# Patient Record
Sex: Male | Born: 1970 | Race: White | Hispanic: No | Marital: Married | Smoking: Never smoker
Health system: Southern US, Community
[De-identification: ages and names within clinical notes are randomized; demographics above are authoritative.]

## PROBLEM LIST (undated history)

## (undated) DIAGNOSIS — F419 Anxiety disorder, unspecified: Secondary | ICD-10-CM

## (undated) DIAGNOSIS — G4730 Sleep apnea, unspecified: Secondary | ICD-10-CM

## (undated) DIAGNOSIS — F32A Depression, unspecified: Secondary | ICD-10-CM

## (undated) HISTORY — DX: Sleep apnea, unspecified: G47.30

## (undated) HISTORY — DX: Anxiety disorder, unspecified: F41.9

---

## 2021-01-08 ENCOUNTER — Inpatient Hospital Stay
Admission: EM | Admit: 2021-01-08 | Discharge: 2021-01-12 | DRG: 177 | Disposition: A | Discharge status: Home / Self Care | Payer: BC Managed Care – PPO | Attending: Internal Medicine | Admitting: Internal Medicine

## 2021-01-08 ENCOUNTER — Emergency Department: Payer: BC Managed Care – PPO

## 2021-01-08 ENCOUNTER — Other Ambulatory Visit: Payer: Self-pay

## 2021-01-08 DIAGNOSIS — Z28310 Unvaccinated for covid-19: Secondary | ICD-10-CM

## 2021-01-08 DIAGNOSIS — J1282 Pneumonia due to coronavirus disease 2019: Secondary | ICD-10-CM | POA: Diagnosis present

## 2021-01-08 DIAGNOSIS — U071 COVID-19: Principal | ICD-10-CM

## 2021-01-08 DIAGNOSIS — J9601 Acute respiratory failure with hypoxia: Secondary | ICD-10-CM | POA: Diagnosis present

## 2021-01-08 DIAGNOSIS — E1165 Type 2 diabetes mellitus with hyperglycemia: Secondary | ICD-10-CM | POA: Diagnosis present

## 2021-01-08 HISTORY — DX: Depression, unspecified: F32.A

## 2021-01-08 LAB — CBC WITH DIFFERENTIAL/PLATELET
Abs Immature Granulocytes: 0.02 10*3/uL (ref 0.00–0.07)
Basophils Absolute: 0 10*3/uL (ref 0.0–0.1)
Basophils Relative: 0 %
Eosinophils Absolute: 0 10*3/uL (ref 0.0–0.5)
Eosinophils Relative: 0 %
HCT: 46.5 % (ref 39.0–52.0)
Hemoglobin: 15.8 g/dL (ref 13.0–17.0)
Immature Granulocytes: 0 %
Lymphocytes Relative: 9 %
Lymphs Abs: 0.4 10*3/uL — ABNORMAL LOW (ref 0.7–4.0)
MCH: 30.5 pg (ref 26.0–34.0)
MCHC: 34 g/dL (ref 30.0–36.0)
MCV: 89.8 fL (ref 80.0–100.0)
Monocytes Absolute: 0.5 10*3/uL (ref 0.1–1.0)
Monocytes Relative: 9 %
Neutro Abs: 4 10*3/uL (ref 1.7–7.7)
Neutrophils Relative %: 82 %
Platelets: 181 10*3/uL (ref 150–400)
RBC: 5.18 MIL/uL (ref 4.22–5.81)
RDW: 13.7 % (ref 11.5–15.5)
WBC: 4.9 10*3/uL (ref 4.0–10.5)
nRBC: 0 % (ref 0.0–0.2)

## 2021-01-08 LAB — BASIC METABOLIC PANEL
Anion gap: 12 (ref 5–15)
BUN: 17 mg/dL (ref 6–20)
CO2: 25 mmol/L (ref 22–32)
Calcium: 8.8 mg/dL — ABNORMAL LOW (ref 8.9–10.3)
Chloride: 96 mmol/L — ABNORMAL LOW (ref 98–111)
Creatinine, Ser: 1.22 mg/dL (ref 0.61–1.24)
GFR, Estimated: >60 mL/min (ref >60)
Glucose, Bld: 140 mg/dL — ABNORMAL HIGH (ref 70–99)
Potassium: 3.8 mmol/L (ref 3.5–5.1)
Sodium: 133 mmol/L — ABNORMAL LOW (ref 135–145)

## 2021-01-08 LAB — LACTIC ACID, PLASMA
Lactic Acid, Venous: 1.5 mmol/L (ref 0.5–1.9)
Lactic Acid, Venous: 2.6 mmol/L (ref 0.5–1.9)

## 2021-01-08 LAB — D-DIMER, QUANTITATIVE: D-Dimer, Quant: 0.77 ug/mL-FEU — ABNORMAL HIGH (ref 0.00–0.50)

## 2021-01-08 MED ORDER — ONDANSETRON HCL 4 MG/2ML IJ SOLN
4.0000 mg | Freq: Four times a day (QID) | INTRAMUSCULAR | Status: DC | PRN
Start: 2021-01-08 — End: 2021-01-12

## 2021-01-08 MED ORDER — SODIUM CHLORIDE 0.9 % IV SOLN
100.0000 mg | Freq: Every day | INTRAVENOUS | Status: AC
  Administered 2021-01-09 – 2021-01-12 (×4): 100 mg via INTRAVENOUS
  Filled 2021-01-08 (×5): qty 20

## 2021-01-08 MED ORDER — METHYLPREDNISOLONE SODIUM SUCC 125 MG IJ SOLR
0.5000 mg/kg | Freq: Two times a day (BID) | INTRAMUSCULAR | Status: AC
  Administered 2021-01-08 – 2021-01-11 (×6): 54.375 mg via INTRAVENOUS
  Filled 2021-01-08 (×7): qty 2

## 2021-01-08 MED ORDER — ASCORBIC ACID 500 MG PO TABS
500.0000 mg | ORAL_TABLET | Freq: Every day | ORAL | Status: DC
  Administered 2021-01-09 – 2021-01-12 (×4): 500 mg via ORAL
  Filled 2021-01-08 (×4): qty 1

## 2021-01-08 MED ORDER — KETOROLAC TROMETHAMINE 30 MG/ML IJ SOLN
30.0000 mg | Freq: Once | INTRAMUSCULAR | Status: AC
  Administered 2021-01-08: 30 mg via INTRAVENOUS
  Filled 2021-01-08: qty 1

## 2021-01-08 MED ORDER — ONDANSETRON HCL 4 MG PO TABS: 4.0000 mg | ORAL_TABLET | Freq: Four times a day (QID) | ORAL | Status: DC | PRN

## 2021-01-08 MED ORDER — ENOXAPARIN SODIUM 60 MG/0.6ML IJ SOSY
0.5000 mg/kg | PREFILLED_SYRINGE | INTRAMUSCULAR | Status: DC
  Administered 2021-01-08 – 2021-01-11 (×4): 55 mg via SUBCUTANEOUS
  Filled 2021-01-08 (×4): qty 0.6

## 2021-01-08 MED ORDER — ACETAMINOPHEN 325 MG PO TABS
650.0000 mg | ORAL_TABLET | Freq: Once | ORAL | Status: AC
  Administered 2021-01-08: 650 mg via ORAL
  Filled 2021-01-08: qty 2

## 2021-01-08 MED ORDER — HYDROCOD POLST-CPM POLST ER 10-8 MG/5ML PO SUER
5.0000 mL | Freq: Two times a day (BID) | ORAL | Status: DC | PRN
  Administered 2021-01-09 – 2021-01-11 (×3): 5 mL via ORAL
  Filled 2021-01-08 (×3): qty 5

## 2021-01-08 MED ORDER — SODIUM CHLORIDE 0.9 % IV BOLUS
1000.0000 mL | Freq: Once | INTRAVENOUS | Status: AC
  Administered 2021-01-08: 1000 mL via INTRAVENOUS

## 2021-01-08 MED ORDER — POLYETHYLENE GLYCOL 3350 17 G PO PACK: 17.0000 g | PACK | Freq: Every day | ORAL | Status: DC | PRN

## 2021-01-08 MED ORDER — ZINC SULFATE 220 (50 ZN) MG PO CAPS
220.0000 mg | ORAL_CAPSULE | Freq: Every day | ORAL | Status: DC
  Administered 2021-01-09 – 2021-01-12 (×4): 220 mg via ORAL
  Filled 2021-01-08 (×4): qty 1

## 2021-01-08 MED ORDER — ACETAMINOPHEN 325 MG PO TABS
650.0000 mg | ORAL_TABLET | Freq: Four times a day (QID) | ORAL | Status: DC | PRN
  Administered 2021-01-10 – 2021-01-12 (×3): 650 mg via ORAL
  Filled 2021-01-08 (×3): qty 2

## 2021-01-08 MED ORDER — ALBUTEROL SULFATE HFA 108 (90 BASE) MCG/ACT IN AERS
2.0000 | INHALATION_SPRAY | Freq: Four times a day (QID) | RESPIRATORY_TRACT | Status: DC
  Administered 2021-01-08 – 2021-01-12 (×15): 2 via RESPIRATORY_TRACT
  Filled 2021-01-08 (×2): qty 6.7

## 2021-01-08 MED ORDER — SODIUM CHLORIDE 0.9 % IV SOLN
200.0000 mg | Freq: Once | INTRAVENOUS | Status: AC
  Administered 2021-01-08: 200 mg via INTRAVENOUS
  Filled 2021-01-08: qty 40

## 2021-01-08 MED ORDER — GUAIFENESIN-DM 100-10 MG/5ML PO SYRP
10.0000 mL | ORAL_SOLUTION | ORAL | Status: DC | PRN
  Administered 2021-01-09 – 2021-01-11 (×3): 10 mL via ORAL
  Filled 2021-01-08 (×3): qty 10

## 2021-01-08 MED ORDER — HYDROCODONE-ACETAMINOPHEN 5-325 MG PO TABS: 1.0000 | ORAL_TABLET | ORAL | Status: DC | PRN

## 2021-01-08 MED ORDER — PREDNISONE 50 MG PO TABS
50.0000 mg | ORAL_TABLET | Freq: Every day | ORAL | Status: DC
  Administered 2021-01-11 – 2021-01-12 (×2): 50 mg via ORAL
  Filled 2021-01-08 (×2): qty 1

## 2021-01-08 MED ORDER — TRAZODONE HCL 50 MG PO TABS: 25.0000 mg | ORAL_TABLET | Freq: Every evening | ORAL | Status: DC | PRN

## 2021-01-08 NOTE — ED Provider Notes (Signed)
[facility] Emergency Department Provider Note   ____________________________________________   Event Date/Time   First MD Initiated Contact with Patient 01/08/21 1241     (approximate)  I have reviewed the triage vital signs and the nursing notes.   HISTORY  Chief Complaint COVID+    HPI Jared Duncan is a 50 y.o. male patient presents with fever, body aches, headache, loss of taste, mild dyspnea with exertion, and diarrhea status post diagnosis of COVID-19 8 days ago.  Patient ECP prescribe Zithromax, Medrol Dosepak, and Tessalon Perles.  Patient states he does not feel he is getting better.  Rates his pain as 8/10.  Described pain as "achy".         History reviewed. No pertinent past medical history.  Patient Active Problem List   Diagnosis Date Noted  . Acute hypoxemic respiratory failure due to COVID-19 [facility]) 01/08/2021    History reviewed. No pertinent surgical history.  Prior to Admission medications   Not on File    Allergies Patient has no allergy information on record.  No family history on file.  Social History    Review of Systems Constitutional: Fever/chills, and body aches. Eyes: No visual changes. ENT: No sore throat.  Loss of taste. Cardiovascular: Denies chest pain. Respiratory: Denies shortness of breath.  Nonproductive cough. Gastrointestinal: No abdominal pain.  No nausea, no vomiting.  No diarrhea.  No constipation. Genitourinary: Negative for dysuria. Musculoskeletal: Negative for back pain. Skin: Negative for rash. Neurological: Positive for headaches, but denies focal weakness or numbness.   ____________________________________________   PHYSICAL EXAM:  VITAL SIGNS: ED Triage Vitals  Enc Vitals Group     BP 01/08/21 1131 140/82     Pulse Rate 01/08/21 1131 (!) 103     Resp 01/08/21 1131 18     Temp 01/08/21 1131 (!) 101.8 F (38.8 C)     Temp Source 01/08/21 1131 Oral     SpO2  01/08/21 1131 92 %     Weight --      Height --      Head Circumference --      Peak Flow --      Pain Score 01/08/21 1131 8     Pain Loc --      Pain Edu? --      Excl. in [place]? --     Constitutional: Febrile alert and oriented. Well appearing and in no acute distress. Eyes: Conjunctivae are normal. PERRL. EOMI. Head: Atraumatic. Nose: No congestion/rhinnorhea. Mouth/Throat: Mucous membranes are moist.  Oropharynx non-erythematous. Neck: No stridor.  Hematological/Lymphatic/Immunilogical: No cervical lymphadenopathy. Cardiovascular: Normal rate, regular rhythm. Grossly normal heart sounds.  Good peripheral circulation. Respiratory: Normal respiratory effort.  No retractions. Lungs CTAB. Gastrointestinal: Soft and nontender. No distention. No abdominal bruits. No CVA tenderness. Genitourinary: Deferred Musculoskeletal: No lower extremity tenderness nor edema.  No joint effusions. Neurologic:  Normal speech and language. No gross focal neurologic deficits are appreciated. No gait instability. Skin:  Skin is warm, dry and intact. No rash noted. Psychiatric: Mood and affect are normal. Speech and behavior are normal.  ____________________________________________   LABS (all labs ordered are listed, but only abnormal results are displayed)  Labs Reviewed  BASIC METABOLIC PANEL - Abnormal; Notable for the following components:      Result Value   Sodium 133 (*)    Chloride 96 (*)    Glucose, Bld 140 (*)    Calcium 8.8 (*)    All other components within normal  limits  CBC WITH DIFFERENTIAL/PLATELET - Abnormal; Notable for the following components:   Lymphs Abs 0.4 (*)    All other components within normal limits  CULTURE, BLOOD (ROUTINE X 2)  CULTURE, BLOOD (ROUTINE X 2)  LACTIC ACID, PLASMA  LACTIC ACID, PLASMA  HIV ANTIBODY (ROUTINE TESTING W REFLEX)  C-REACTIVE PROTEIN  D-DIMER, QUANTITATIVE    ____________________________________________  EKG   ____________________________________________  RADIOLOGY I, Sable Feil, personally viewed and evaluated these images (plain radiographs) as part of my medical decision making, as well as reviewing the written report by the radiologist.  ED MD interpretation: Pneumonia  Official radiology report(s): DG Chest Portable 1 View  Result Date: 01/08/2021 CLINICAL DATA:  Dyspnea and cough.  COVID-19 pneumonia. EXAM: PORTABLE CHEST 1 VIEW COMPARISON:  None. FINDINGS: The lung volumes are low. Diffuse reticular and nodular interstitial opacities are noted throughout both lungs compatible with history of COVID pneumonia. No signs of pleural effusion. No pneumothorax or atelectasis identified. IMPRESSION: Diffuse bilateral reticular and nodular interstitial opacities compatible with history of COVID pneumonia. Electronically Signed   By: Kerby Moors M.D.   On: 01/08/2021 13:25    ____________________________________________   PROCEDURES  Procedure(s) performed (including Critical Care):  Procedures   ____________________________________________   INITIAL IMPRESSION / ASSESSMENT AND PLAN / ED COURSE  As part of my medical decision making, I reviewed the following data within the [facility]         Patient presents with 8 days of cough, chest congestion, body aches, fatigue.  Patient test positive for COVID-19 8 days ago.  Patient refractory PCP prescriptions Zithromax, Medrol Dosepak, and Tessalon.  Discussed x-ray findings with patient consistent with COVID pneumonia.  Patient will be admitted for definitive treatment.      ____________________________________________   FINAL CLINICAL IMPRESSION(S) / ED DIAGNOSES  Final diagnoses:  Pneumonia due to 2019 novel coronavirus     ED Discharge Orders    None      *Please note:  Jared Duncan was evaluated in Emergency Department on  01/08/2021 for the symptoms described in the history of present illness. He was evaluated in the context of the global COVID-19 pandemic, which necessitated consideration that the patient might be at risk for infection with the SARS-CoV-2 virus that causes COVID-19. Institutional protocols and algorithms that pertain to the evaluation of patients at risk for COVID-19 are in a state of rapid change based on information released by regulatory bodies including the CDC and federal and state organizations. These policies and algorithms were followed during the patient's care in the ED.  Some ED evaluations and interventions may be delayed as a result of limited staffing during and the pandemic.*   Note:  This document was prepared using Dragon voice recognition software and may include unintentional dictation errors.    Sable Feil, PA-C 01/08/21 1502    Arta Silence, MD 01/08/21 1504

## 2021-01-08 NOTE — Progress Notes (Signed)
Anticoagulation monitoring(Lovenox):  49yo  M ordered Lovenox 40 mg Q24h  Filed Weights   01/08/21 1748  Weight: 109.3 kg (241 lb)   BMI 32.6   Lab Results  Component Value Date   CREATININE 1.22 01/08/2021   Estimated Creatinine Clearance: 93.5 mL/min (by C-G formula based on SCr of 1.22 mg/dL). Hemoglobin & Hematocrit     Component Value Date/Time   HGB 15.8 01/08/2021 1310   HCT 46.5 01/08/2021 1310     Per Protocol for Patient with estCrcl > 30 ml/min and BMI > 30, will transition to Lovenox 0.5 mg/kg  Q24h.     Chinita Greenland PharmD Clinical Pharmacist 01/08/2021

## 2021-01-08 NOTE — ED Notes (Signed)
Placed patient on 2L [place] due to low 02 rate.

## 2021-01-08 NOTE — H&P (Signed)
History and Physical    Ming Mcmannis BZJ:[id number] DOB: May 25, 1971 DOA: 01/08/2021  PCP: Albina Billet, MD  Patient coming from: Home  I have personally briefly reviewed patient's old medical records in [place]  Chief Complaint:Weakness, SOB, COVID+  HPI: Jared Duncan is a 50 y.o. male with no significant medical history who presents for evaluation of worsening COVID symptoms.  Patient was initially tested for COVID 8 days ago and results of test were positive.  Patient saw an outpatient provider and was prescribed azithromycin, Tessalon, Medrol Dosepak.  He said he completed the prescribed medications and continued to have persistent symptoms and thus presented to the emergency room.  On presentation patient is hemodynamically stable.  He is mildly hypoxic requiring 2 L of oxygen for saturations lower than 88%.  He was febrile to 101.8 in ED.  Patient's main symptoms were weakness.  He does endorse some cough and shortness of breath as well.  Patient is unvaccinated for COVID.  ED Course: Chest x-ray demonstrates bilateral multifocal infiltrates consistent with COVID-pneumonia.  ED provider gave Tylenol, Toradol, 1 L of fluids and called hospitalist for admission.  No COVID medications were started in the ED.  Review of Systems: As per HPI otherwise 14 point review of systems negative.    History reviewed. No pertinent past medical history.  History reviewed. No pertinent surgical history.   has no history on file for tobacco use, alcohol use, and drug use.  Not on File  No family history on file. No family history of pneumonia or COVID infection  Prior to Admission medications   Not on File    Physical Exam: Vitals:   01/08/21 1354 01/08/21 1355 01/08/21 1355 01/08/21 1356  BP:   130/82   Pulse: 81 87 87 84  Resp:   18   Temp:   99.2 F (37.3 C)   TempSrc:   Oral   SpO2:   (!) 89%      Vitals:   01/08/21 1354 01/08/21 1355  01/08/21 1355 01/08/21 1356  BP:   130/82   Pulse: 81 87 87 84  Resp:   18   Temp:   99.2 F (37.3 C)   TempSrc:   Oral   SpO2:   (!) 89%   Constitutional: NAD, calm, comfortable Eyes: PERRL, lids and conjunctivae normal ENMT: Mucous membranes are moist. Posterior pharynx clear of any exudate or lesions.Normal dentition.  Neck: normal, supple, no masses, no thyromegaly Respiratory: Normal work of breathing.  Bilateral scattered crackles.  2 L Cardiovascular: Regular rate and rhythm, no murmurs / rubs / gallops. No extremity edema. 2+ pedal pulses. No carotid bruits.  Abdomen: no tenderness, no masses palpated. No hepatosplenomegaly. Bowel sounds positive.  Musculoskeletal: no clubbing / cyanosis. No joint deformity upper and lower extremities. Good ROM, no contractures. Normal muscle tone.  Skin: no rashes, lesions, ulcers. No induration Neurologic: CN 2-12 grossly intact. Sensation intact, DTR normal. Strength 5/5 in all 4.  Psychiatric: Normal judgment and insight. Alert and oriented x 3. Normal mood.    Labs on Admission: I have personally reviewed following labs and imaging studies  CBC: Recent Labs  Lab 01/08/21 1310  WBC 4.9  NEUTROABS 4.0  HGB 15.8  HCT 46.5  MCV 89.8  PLT 017   Basic Metabolic Panel: Recent Labs  Lab 01/08/21 1310  NA 133*  K 3.8  CL 96*  CO2 25  GLUCOSE 140*  BUN 17  CREATININE 1.22  CALCIUM  8.8*   GFR: CrCl cannot be calculated (Unknown ideal weight.). Liver Function Tests: No results for input(s): AST, ALT, ALKPHOS, BILITOT, PROT, ALBUMIN in the last 168 hours. No results for input(s): LIPASE, AMYLASE in the last 168 hours. No results for input(s): AMMONIA in the last 168 hours. Coagulation Profile: No results for input(s): INR, PROTIME in the last 168 hours. Cardiac Enzymes: No results for input(s): CKTOTAL, CKMB, CKMBINDEX, TROPONINI in the last 168 hours. BNP (last 3 results) No results for input(s): PROBNP in the last 8760  hours. HbA1C: No results for input(s): HGBA1C in the last 72 hours. CBG: No results for input(s): GLUCAP in the last 168 hours. Lipid Profile: No results for input(s): CHOL, HDL, LDLCALC, TRIG, CHOLHDL, LDLDIRECT in the last 72 hours. Thyroid Function Tests: No results for input(s): TSH, T4TOTAL, FREET4, T3FREE, THYROIDAB in the last 72 hours. Anemia Panel: No results for input(s): VITAMINB12, FOLATE, FERRITIN, TIBC, IRON, RETICCTPCT in the last 72 hours. Urine analysis: No results found for: COLORURINE, APPEARANCEUR, [place], [place], GLUCOSEU, HGBUR, BILIRUBINUR, KETONESUR, PROTEINUR, UROBILINOGEN, NITRITE, LEUKOCYTESUR  Radiological Exams on Admission: DG Chest Portable 1 View  Result Date: 01/08/2021 CLINICAL DATA:  Dyspnea and cough.  COVID-19 pneumonia. EXAM: PORTABLE CHEST 1 VIEW COMPARISON:  None. FINDINGS: The lung volumes are low. Diffuse reticular and nodular interstitial opacities are noted throughout both lungs compatible with history of COVID pneumonia. No signs of pleural effusion. No pneumothorax or atelectasis identified. IMPRESSION: Diffuse bilateral reticular and nodular interstitial opacities compatible with history of COVID pneumonia. Electronically Signed   By: Kerby Moors M.D.   On: 01/08/2021 13:25    EKG: Independently reviewed.  Normal sinus rhythm  Assessment/Plan Active Problems:   Acute hypoxemic respiratory failure due to COVID-19 [facility])   Multifocal pneumonia in setting of COVID-19 infection Acute hypoxemic respiratory failure secondary to above Patient with 8days since positive test Chest x-ray here demonstrates multifocal pneumonia Mildly hypoxic but hemodynamically stable Plan: Admit inpatient [place] and contact cautions Initiate remdesivir, pharmacy dosing Solu-Medrol 0.5 mg/kg every 12 hours Submental oxygen, wean as tolerated Daily inflammatory markers Prone as tolerated Stress I-S and flutter valve use As needed pain control As  needed nausea control Supplemental vitamins Check blood culture rule out bacteremia Check CRP, if elevated consider addition of baricitinib Check D-dimer, if elevated consider rule out PE with CT angiogram   DVT prophylaxis: SQ Lovenox Code Status: Full Family Communication: None today.  Plan of care discussed at length with patient at bedside.  Offered to call family but patient declined. Disposition Plan: Anticipate return to previous home environment  consults called: None Admission status: Inpatient, MedSurg   Sidney Ace MD [facility]  If 7PM-7AM, please contact night-coverage  01/08/2021, 2:54 PM

## 2021-01-08 NOTE — ED Notes (Signed)
Tech to transport tech to C-POD

## 2021-01-08 NOTE — ED Notes (Signed)
Informed RN bed assigned 

## 2021-01-08 NOTE — ED Triage Notes (Signed)
Pt comes with c/o COVID+ and continuous fever, headache, loss of taste. Pt states he was dx with COVID last Tuesday. Pt prescribed meds and just finished them. Pt states he isn't getting better.

## 2021-01-09 ENCOUNTER — Encounter: Payer: Self-pay | Admitting: Internal Medicine

## 2021-01-09 DIAGNOSIS — J1282 Pneumonia due to coronavirus disease 2019: Secondary | ICD-10-CM | POA: Diagnosis not present

## 2021-01-09 DIAGNOSIS — U071 COVID-19: Secondary | ICD-10-CM | POA: Diagnosis not present

## 2021-01-09 LAB — COMPREHENSIVE METABOLIC PANEL
ALT: 58 U/L — ABNORMAL HIGH (ref 0–44)
AST: 43 U/L — ABNORMAL HIGH (ref 15–41)
Albumin: 3.6 g/dL (ref 3.5–5.0)
Alkaline Phosphatase: 53 U/L (ref 38–126)
Anion gap: 11 (ref 5–15)
BUN: 20 mg/dL (ref 6–20)
CO2: 24 mmol/L (ref 22–32)
Calcium: 8.7 mg/dL — ABNORMAL LOW (ref 8.9–10.3)
Chloride: 102 mmol/L (ref 98–111)
Creatinine, Ser: 0.97 mg/dL (ref 0.61–1.24)
GFR, Estimated: >60 mL/min (ref >60)
Glucose, Bld: 161 mg/dL — ABNORMAL HIGH (ref 70–99)
Potassium: 4.4 mmol/L (ref 3.5–5.1)
Sodium: 137 mmol/L (ref 135–145)
Total Bilirubin: 0.6 mg/dL (ref 0.3–1.2)
Total Protein: 7.2 g/dL (ref 6.5–8.1)

## 2021-01-09 LAB — C-REACTIVE PROTEIN
CRP: 5.4 mg/dL — ABNORMAL HIGH (ref <1.0)
CRP: 5.5 mg/dL — ABNORMAL HIGH (ref <1.0)

## 2021-01-09 LAB — CBC WITH DIFFERENTIAL/PLATELET
Abs Immature Granulocytes: 0.02 10*3/uL (ref 0.00–0.07)
Basophils Absolute: 0 10*3/uL (ref 0.0–0.1)
Basophils Relative: 0 %
Eosinophils Absolute: 0 10*3/uL (ref 0.0–0.5)
Eosinophils Relative: 0 %
HCT: 43.5 % (ref 39.0–52.0)
Hemoglobin: 14.9 g/dL (ref 13.0–17.0)
Immature Granulocytes: 0 %
Lymphocytes Relative: 12 %
Lymphs Abs: 0.6 10*3/uL — ABNORMAL LOW (ref 0.7–4.0)
MCH: 31.1 pg (ref 26.0–34.0)
MCHC: 34.3 g/dL (ref 30.0–36.0)
MCV: 90.8 fL (ref 80.0–100.0)
Monocytes Absolute: 0.4 10*3/uL (ref 0.1–1.0)
Monocytes Relative: 7 %
Neutro Abs: 4.5 10*3/uL (ref 1.7–7.7)
Neutrophils Relative %: 81 %
Platelets: 172 10*3/uL (ref 150–400)
RBC: 4.79 MIL/uL (ref 4.22–5.81)
RDW: 13.7 % (ref 11.5–15.5)
WBC: 5.5 10*3/uL (ref 4.0–10.5)
nRBC: 0 % (ref 0.0–0.2)

## 2021-01-09 LAB — HIV ANTIBODY (ROUTINE TESTING W REFLEX): HIV Screen 4th Generation wRfx: NONREACTIVE

## 2021-01-09 MED ORDER — ESCITALOPRAM OXALATE 10 MG PO TABS
5.0000 mg | ORAL_TABLET | Freq: Every day | ORAL | Status: DC
  Administered 2021-01-09 – 2021-01-12 (×4): 5 mg via ORAL
  Filled 2021-01-09 (×4): qty 0.5

## 2021-01-09 MED ORDER — ADULT MULTIVITAMIN W/MINERALS CH
1.0000 | ORAL_TABLET | Freq: Every day | ORAL | Status: DC
  Administered 2021-01-09 – 2021-01-12 (×4): 1 via ORAL
  Filled 2021-01-09 (×4): qty 1

## 2021-01-09 MED ORDER — GLUCERNA SHAKE PO LIQD
237.0000 mL | Freq: Two times a day (BID) | ORAL | Status: DC
  Administered 2021-01-09 – 2021-01-12 (×6): 237 mL via ORAL

## 2021-01-09 NOTE — Progress Notes (Signed)
PROGRESS NOTE    Jared Duncan  MWN:[id number] DOB: 02-20-1971 DOA: 01/08/2021 PCP: Albina Billet, MD   Brief Narrative: 50 year old with no significant past medical history, who presents with worsening COVID symptoms.  Patient tested positive for COVID 8 days prior to admission.  He was treated as an outpatient with azithromycin, Medrol Dosepak.  He completed treatment, he continued to have worsening of symptoms.  Evaluation in the ED patient was found to be hypoxic oxygen saturation 88 on room air, febrile with temperature 101.  Chest x-ray; show bilateral multifocal infiltrates consistent with COVID-pneumonia.    Assessment & Plan:   Active Problems:   Acute hypoxemic respiratory failure due to COVID-19 ([place])  1-Acute hypoxic respiratory failure, multifocal pneumonia secondary to COVID-19 infection -Presented with worsening shortness of breath, chest x-ray with bilateral infiltrate, hypoxic on presentation and febrile. -Continue with Remdesivir and Solu-Medrol.  -Continue with Zinc, Vitamin C.  -Lovenox for DVT prophylaxis.  Prone position  -Continue with oxygen supplementation.     Estimated body mass index is 4,749.04 kg/m as calculated from the following:   Height as of this encounter: 6" (0.152 m).   Weight as of this encounter: 110.3 kg.   DVT prophylaxis: Lovenox Code Status: Full code Family Communication: care discussed with patient Disposition Plan:  Status is: Inpatient  Remains inpatient appropriate because:IV treatments appropriate due to intensity of illness or inability to take PO   Dispo: The patient is from: Home              Anticipated d/c is to: Home              Patient currently is not medically stable to d/c.   Difficult to place patient No        Consultants:   None  Procedures:   None  Antimicrobials:    Subjective: He is feeling better, he report SOB and cough.   Objective: Vitals:   01/08/21 2020 01/09/21  0101 01/09/21 0440 01/09/21 0824  BP: 118/74 137/82 107/74 116/75  Pulse: 78 (!) 56 70 66  Resp: 18 18 16 16   Temp: 98.7 F (37.1 C) 98.5 F (36.9 C) 98.3 F (36.8 C) 98.6 F (37 C)  TempSrc: Oral Oral Oral   SpO2: 95% 97% 97% (!) 86%  Weight:      Height:        Intake/Output Summary (Last 24 hours) at 01/09/2021 0900 Last data filed at 01/08/2021 1837 Gross per 24 hour  Intake 290 ml  Output --  Net 290 ml   Filed Weights   01/08/21 1748 01/08/21 2009  Weight: 109.3 kg 110.3 kg    Examination:  General exam: Appears calm and comfortable  Respiratory system: BO ronchus Cardiovascular system: S1 & S2 heard, RRR.  Gastrointestinal system: Abdomen is nondistended, soft and nontender. No organomegaly or masses felt. Normal bowel sounds heard. Central nervous system: Alert and oriented. No focal neurological deficits. Extremities: Symmetric 5 x 5 power.    Data Reviewed: I have personally reviewed following labs and imaging studies  CBC: Recent Labs  Lab 01/08/21 1310 01/09/21 0540  WBC 4.9 5.5  NEUTROABS 4.0 4.5  HGB 15.8 14.9  HCT 46.5 43.5  MCV 89.8 90.8  PLT 181 403   Basic Metabolic Panel: Recent Labs  Lab 01/08/21 1310 01/09/21 0540  NA 133* 137  K 3.8 4.4  CL 96* 102  CO2 25 24  GLUCOSE 140* 161*  BUN 17 20  CREATININE 1.22 0.97  CALCIUM 8.8* 8.7*   GFR: CrCl cannot be calculated (Unknown ideal weight.). Liver Function Tests: Recent Labs  Lab 01/09/21 0540  AST 43*  ALT 58*  ALKPHOS 53  BILITOT 0.6  PROT 7.2  ALBUMIN 3.6   No results for input(s): LIPASE, AMYLASE in the last 168 hours. No results for input(s): AMMONIA in the last 168 hours. Coagulation Profile: No results for input(s): INR, PROTIME in the last 168 hours. Cardiac Enzymes: No results for input(s): CKTOTAL, CKMB, CKMBINDEX, TROPONINI in the last 168 hours. BNP (last 3 results) No results for input(s): PROBNP in the last 8760 hours. HbA1C: No results for input(s):  HGBA1C in the last 72 hours. CBG: No results for input(s): GLUCAP in the last 168 hours. Lipid Profile: No results for input(s): CHOL, HDL, LDLCALC, TRIG, CHOLHDL, LDLDIRECT in the last 72 hours. Thyroid Function Tests: No results for input(s): TSH, T4TOTAL, FREET4, T3FREE, THYROIDAB in the last 72 hours. Anemia Panel: No results for input(s): VITAMINB12, FOLATE, FERRITIN, TIBC, IRON, RETICCTPCT in the last 72 hours. Sepsis Labs: Recent Labs  Lab 01/08/21 1344 01/08/21 2044  LATICACIDVEN 1.5 2.6*    Recent Results (from the past 240 hour(s))  Culture, blood (Routine X 2) w Reflex to ID Panel     Status: None (Preliminary result)   Collection Time: 01/08/21  8:44 PM   Specimen: BLOOD  Result Value Ref Range Status   Specimen Description BLOOD BLOOD LEFT HAND  Final   Special Requests   Final    BOTTLES DRAWN AEROBIC AND ANAEROBIC Blood Culture adequate volume   Culture   Final    NO GROWTH < 12 HOURS Performed at [facility], [address], [place], [place] [address]    Report Status PENDING  Incomplete  Culture, blood (Routine X 2) w Reflex to ID Panel     Status: None (Preliminary result)   Collection Time: 01/08/21  8:44 PM   Specimen: BLOOD  Result Value Ref Range Status   Specimen Description BLOOD LEFT ANTECUBITAL  Final   Special Requests   Final    BOTTLES DRAWN AEROBIC AND ANAEROBIC Blood Culture adequate volume   Culture   Final    NO GROWTH < 12 HOURS Performed at [facility], [address], [place], [place] [address]    Report Status PENDING  Incomplete         Radiology Studies: DG Chest Portable 1 View  Result Date: 01/08/2021 CLINICAL DATA:  Dyspnea and cough.  COVID-19 pneumonia. EXAM: PORTABLE CHEST 1 VIEW COMPARISON:  None. FINDINGS: The lung volumes are low. Diffuse reticular and nodular interstitial opacities are noted throughout both lungs compatible with history of COVID pneumonia. No signs of pleural effusion. No  pneumothorax or atelectasis identified. IMPRESSION: Diffuse bilateral reticular and nodular interstitial opacities compatible with history of COVID pneumonia. Electronically Signed   By: Kerby Moors M.D.   On: 01/08/2021 13:25        Scheduled Meds: . albuterol  2 puff Inhalation Q6H  . vitamin C  500 mg Oral Daily  . enoxaparin (LOVENOX) injection  0.5 mg/kg Subcutaneous Q24H  . methylPREDNISolone (SOLU-MEDROL) injection  0.5 mg/kg Intravenous Q12H   Followed by  . [START ON 01/11/2021] predniSONE  50 mg Oral Daily  . zinc sulfate  220 mg Oral Daily   Continuous Infusions: . remdesivir 100 mg in NS 100 mL       LOS: 1 day    Time spent: 35 minutes  Elmarie Shiley, MD [facility]   If 7PM-7AM, please contact night-coverage [email or web address]  01/09/2021, 9:00 AM

## 2021-01-09 NOTE — Plan of Care (Signed)
Pt doing better today, besides cough and pain, breathing is better, flutter valve teaching and albuterol inh taught

## 2021-01-09 NOTE — Progress Notes (Signed)
Initial Nutrition Assessment  DOCUMENTATION CODES:  Obesity unspecified  INTERVENTION:   Continue current diet as order, encouraged PO intake  Glucerna Shake po BID, each supplement provides 220 kcal and 10 grams of protein  MVI with minerals daily  Continue vitamin C and zinc as ordered. Discontinue supplements prior to discahrge to avoid long-term supplementation outpatient  NUTRITION DIAGNOSIS:  Increased nutrient needs related to acute illness (COVID19) as evidenced by estimated needs.  GOAL:  Patient will meet greater than or equal to 90% of their needs  MONITOR:  PO intake,Supplement acceptance  REASON FOR ASSESSMENT:  Malnutrition Screening Tool    ASSESSMENT:  Pt presented to ED with worsening COVID symptoms (weakness, cough, SOB) after testing positive 5/10. Patient received treatment outpatient but symptoms continue. Found to be mildly hypoxic in ED and febrile.  Discussed recent nutrition status with pt. Pt reports that his appetite is almost completely back to normal which is a vast improvement from the past week. Pt reports that for the 8 days PTA, all he was able to tolerate was toast. Initially had GI distress with COVID19 but states since yesterday, he seems to be improving. Pt reports he never lost his taste and smell. Feels that breathing and energy are slowly improving, has been trying to take short walks around his room to build up his stamina.   Discussed increased fluid and nutrition needs with COVID19, pt agreeable to trying a supplement until dc to aid in recovery. Prefers a lower carb option - states he has been prediabetic for sometime and tries to avoid concentrated amounts of sugar. Will also add MVI.     Relevant Scheduled Meds: . vitamin C  500 mg Oral Daily  . methylPREDNISolone injection  0.5 mg/kg Intravenous Q12H  . zinc sulfate  220 mg Oral Daily   Relevant Continuous Infusions: . remdesivir 100 mg in NS 100 mL 100 mg (01/09/21 0905)    Relevant PRN Meds: ondansetron, polyethylene glycol  Labs reviewed:  SBG ranges from 140-161 mg/dL over the last 24 hours  NUTRITION - FOCUSED PHYSICAL EXAM: Unable to assess due to isolation status  Diet Order:   Diet Order            Diet regular Room service appropriate? Yes; Fluid consistency: Thin  Diet effective now                EDUCATION NEEDS:  Education needs have been addressed  Skin:  Skin Assessment: Reviewed RN Assessment  Last BM:  5/18 per RN documentation  Height:  Ht Readings from Last 1 Encounters:  01/08/21 6' (1.829 m)    Weight:  Wt Readings from Last 1 Encounters:  01/08/21 110.3 kg   Ideal Body Weight:  80.9 kg  BMI:  Body mass index is 32.98 kg/m.  Estimated Nutritional Needs:   Kcal:  2200-2500 kcal/d  Protein:  115-130 g/d  Fluid:  >2.2 L/d   Ranell Patrick, RD, LDN Clinical Dietitian Pager on [place]

## 2021-01-10 DIAGNOSIS — U071 COVID-19: Secondary | ICD-10-CM | POA: Diagnosis not present

## 2021-01-10 DIAGNOSIS — J9601 Acute respiratory failure with hypoxia: Secondary | ICD-10-CM | POA: Diagnosis not present

## 2021-01-10 LAB — COMPREHENSIVE METABOLIC PANEL
ALT: 54 U/L — ABNORMAL HIGH (ref 0–44)
AST: 36 U/L (ref 15–41)
Albumin: 3.3 g/dL — ABNORMAL LOW (ref 3.5–5.0)
Alkaline Phosphatase: 52 U/L (ref 38–126)
Anion gap: 8 (ref 5–15)
BUN: 18 mg/dL (ref 6–20)
CO2: 27 mmol/L (ref 22–32)
Calcium: 8.8 mg/dL — ABNORMAL LOW (ref 8.9–10.3)
Chloride: 102 mmol/L (ref 98–111)
Creatinine, Ser: 0.84 mg/dL (ref 0.61–1.24)
GFR, Estimated: >60 mL/min (ref >60)
Glucose, Bld: 184 mg/dL — ABNORMAL HIGH (ref 70–99)
Potassium: 4.2 mmol/L (ref 3.5–5.1)
Sodium: 137 mmol/L (ref 135–145)
Total Bilirubin: 0.6 mg/dL (ref 0.3–1.2)
Total Protein: 6.7 g/dL (ref 6.5–8.1)

## 2021-01-10 LAB — CBC WITH DIFFERENTIAL/PLATELET
Abs Immature Granulocytes: 0.05 10*3/uL (ref 0.00–0.07)
Basophils Absolute: 0 10*3/uL (ref 0.0–0.1)
Basophils Relative: 0 %
Eosinophils Absolute: 0 10*3/uL (ref 0.0–0.5)
Eosinophils Relative: 0 %
HCT: 41.9 % (ref 39.0–52.0)
Hemoglobin: 14.5 g/dL (ref 13.0–17.0)
Immature Granulocytes: 1 %
Lymphocytes Relative: 9 %
Lymphs Abs: 0.6 10*3/uL — ABNORMAL LOW (ref 0.7–4.0)
MCH: 31.3 pg (ref 26.0–34.0)
MCHC: 34.6 g/dL (ref 30.0–36.0)
MCV: 90.3 fL (ref 80.0–100.0)
Monocytes Absolute: 0.4 10*3/uL (ref 0.1–1.0)
Monocytes Relative: 6 %
Neutro Abs: 6.1 10*3/uL (ref 1.7–7.7)
Neutrophils Relative %: 84 %
Platelets: 200 10*3/uL (ref 150–400)
RBC: 4.64 MIL/uL (ref 4.22–5.81)
RDW: 13.8 % (ref 11.5–15.5)
WBC: 7.2 10*3/uL (ref 4.0–10.5)
nRBC: 0 % (ref 0.0–0.2)

## 2021-01-10 LAB — C-REACTIVE PROTEIN: CRP: 2.6 mg/dL — ABNORMAL HIGH (ref <1.0)

## 2021-01-10 NOTE — Progress Notes (Signed)
PROGRESS NOTE    Jared Duncan  WUJ:[id number] DOB: 1971/06/27 DOA: 01/08/2021 PCP: Albina Billet, MD   Brief Narrative: 50 year old with no significant past medical history, who presents with worsening COVID symptoms.  Patient tested positive for COVID 8 days prior to admission.  He was treated as an outpatient with azithromycin, Medrol Dosepak.  He completed treatment, he continued to have worsening of symptoms.  Evaluation in the ED patient was found to be hypoxic oxygen saturation 88 on room air, febrile with temperature 101.  Chest x-ray; show bilateral multifocal infiltrates consistent with COVID-pneumonia.    Assessment & Plan:   Active Problems:   Acute hypoxemic respiratory failure due to COVID-19 (HCC)  1-Acute hypoxic respiratory failure, multifocal pneumonia secondary to COVID-19 infection -Presented with worsening shortness of breath, chest x-ray with bilateral infiltrate, hypoxic on presentation and febrile. -Continue with Remdesivir day 3 and Solu-Medrol.  -Continue with Zinc, Vitamin C.  -Lovenox for DVT prophylaxis.  Prone position  -Continue with oxygen supplementation.  COVID-19 Labs  Recent Labs    01/08/21 2044 01/09/21 0540 01/10/21 0553  DDIMER 0.77*  --   --   CRP 5.4* 5.5* 2.6*  Improving. Plan to try to wean off oxygen.    No results found for: SARSCOV2NAA   Estimated body mass index is 32.98 kg/m as calculated from the following:   Height as of this encounter: 6' (1.829 m).   Weight as of this encounter: 110.3 kg.   DVT prophylaxis: Lovenox Code Status: Full code Family Communication: care discussed with patient Disposition Plan:  Status is: Inpatient  Remains inpatient appropriate because:IV treatments appropriate due to intensity of illness or inability to take PO   Dispo: The patient is from: Home              Anticipated d/c is to: Home              Patient currently is not medically stable to d/c.   Difficult to  place patient No        Consultants:   None  Procedures:   None  Antimicrobials:    Subjective: He couldn't sleep last night, is difficult for him to sleep on prone position. He has been able to cough more phlegm.  His breathing is stable.   Objective: Vitals:   01/10/21 0551 01/10/21 0648 01/10/21 0915 01/10/21 1247  BP: 112/72 106/66 115/68 110/67  Pulse: 61 69 62 61  Resp: (!) 28 18 18 18   Temp: 98.4 F (36.9 C) 98.4 F (36.9 C) 98.4 F (36.9 C) 97.9 F (36.6 C)  TempSrc: Oral Oral  Oral  SpO2: 93% 96% 95% 94%  Weight:      Height:        Intake/Output Summary (Last 24 hours) at 01/10/2021 1344 Last data filed at 01/09/2021 1702 Gross per 24 hour  Intake 326.94 ml  Output --  Net 326.94 ml   Filed Weights   01/08/21 1748 01/08/21 2009  Weight: 109.3 kg 110.3 kg    Examination:  General exam: NAD Respiratory system: No wheezing , no crackles.  Cardiovascular system: S 1, S 2 RRR Gastrointestinal system: BS present, soft, nt Central nervous system: Non focal.  Extremities: Symmetric power    Data Reviewed: I have personally reviewed following labs and imaging studies  CBC: Recent Labs  Lab 01/08/21 1310 01/09/21 0540 01/10/21 0553  WBC 4.9 5.5 7.2  NEUTROABS 4.0 4.5 6.1  HGB 15.8 14.9 14.5  HCT 46.5 43.5  41.9  MCV 89.8 90.8 90.3  PLT 181 172 242   Basic Metabolic Panel: Recent Labs  Lab 01/08/21 1310 01/09/21 0540 01/10/21 0553  NA 133* 137 137  K 3.8 4.4 4.2  CL 96* 102 102  CO2 25 24 27   GLUCOSE 140* 161* 184*  BUN 17 20 18   CREATININE 1.22 0.97 0.84  CALCIUM 8.8* 8.7* 8.8*   GFR: Estimated Creatinine Clearance: 136.5 mL/min (by C-G formula based on SCr of 0.84 mg/dL). Liver Function Tests: Recent Labs  Lab 01/09/21 0540 01/10/21 0553  AST 43* 36  ALT 58* 54*  ALKPHOS 53 52  BILITOT 0.6 0.6  PROT 7.2 6.7  ALBUMIN 3.6 3.3*   No results for input(s): LIPASE, AMYLASE in the last 168 hours. No results for  input(s): AMMONIA in the last 168 hours. Coagulation Profile: No results for input(s): INR, PROTIME in the last 168 hours. Cardiac Enzymes: No results for input(s): CKTOTAL, CKMB, CKMBINDEX, TROPONINI in the last 168 hours. BNP (last 3 results) No results for input(s): PROBNP in the last 8760 hours. HbA1C: No results for input(s): HGBA1C in the last 72 hours. CBG: No results for input(s): GLUCAP in the last 168 hours. Lipid Profile: No results for input(s): CHOL, HDL, LDLCALC, TRIG, CHOLHDL, LDLDIRECT in the last 72 hours. Thyroid Function Tests: No results for input(s): TSH, T4TOTAL, FREET4, T3FREE, THYROIDAB in the last 72 hours. Anemia Panel: No results for input(s): VITAMINB12, FOLATE, FERRITIN, TIBC, IRON, RETICCTPCT in the last 72 hours. Sepsis Labs: Recent Labs  Lab 01/08/21 1344 01/08/21 2044  LATICACIDVEN 1.5 2.6*    Recent Results (from the past 240 hour(s))  Culture, blood (Routine X 2) w Reflex to ID Panel     Status: None (Preliminary result)   Collection Time: 01/08/21  8:44 PM   Specimen: BLOOD  Result Value Ref Range Status   Specimen Description BLOOD BLOOD LEFT HAND  Final   Special Requests   Final    BOTTLES DRAWN AEROBIC AND ANAEROBIC Blood Culture adequate volume   Culture   Final    NO GROWTH 2 DAYS Performed at [facility], [address] [place], [place] [address]    Report Status PENDING  Incomplete  Culture, blood (Routine X 2) w Reflex to ID Panel     Status: None (Preliminary result)   Collection Time: 01/08/21  8:44 PM   Specimen: BLOOD  Result Value Ref Range Status   Specimen Description BLOOD LEFT ANTECUBITAL  Final   Special Requests   Final    BOTTLES DRAWN AEROBIC AND ANAEROBIC Blood Culture adequate volume   Culture   Final    NO GROWTH 2 DAYS Performed at [facility], [address] [place], [place] [address]    Report Status PENDING  Incomplete         Radiology Studies: No results  found.      Scheduled Meds: . albuterol  2 puff Inhalation Q6H  . vitamin C  500 mg Oral Daily  . enoxaparin (LOVENOX) injection  0.5 mg/kg Subcutaneous Q24H  . escitalopram  5 mg Oral Daily  . feeding supplement (GLUCERNA SHAKE)  237 mL Oral BID BM  . methylPREDNISolone (SOLU-MEDROL) injection  0.5 mg/kg Intravenous Q12H   Followed by  . [START ON 01/11/2021] predniSONE  50 mg Oral Daily  . multivitamin with minerals  1 tablet Oral Daily  . zinc sulfate  220 mg Oral Daily   Continuous Infusions: . remdesivir 100 mg in NS 100  mL 100 mg (01/10/21 0837)     LOS: 2 days    Time spent: 35 minutes    Brenley Priore A Goku Harb, MD [facility]   If 7PM-7AM, please contact night-coverage [email or web address]  01/10/2021, 1:44 PM

## 2021-01-11 DIAGNOSIS — J1282 Pneumonia due to coronavirus disease 2019: Secondary | ICD-10-CM | POA: Diagnosis not present

## 2021-01-11 DIAGNOSIS — U071 COVID-19: Secondary | ICD-10-CM | POA: Diagnosis not present

## 2021-01-11 LAB — COMPREHENSIVE METABOLIC PANEL
ALT: 60 U/L — ABNORMAL HIGH (ref 0–44)
AST: 36 U/L (ref 15–41)
Albumin: 3.4 g/dL — ABNORMAL LOW (ref 3.5–5.0)
Alkaline Phosphatase: 52 U/L (ref 38–126)
Anion gap: 8 (ref 5–15)
BUN: 20 mg/dL (ref 6–20)
CO2: 29 mmol/L (ref 22–32)
Calcium: 8.8 mg/dL — ABNORMAL LOW (ref 8.9–10.3)
Chloride: 100 mmol/L (ref 98–111)
Creatinine, Ser: 0.99 mg/dL (ref 0.61–1.24)
GFR, Estimated: >60 mL/min (ref >60)
Glucose, Bld: 233 mg/dL — ABNORMAL HIGH (ref 70–99)
Potassium: 4.6 mmol/L (ref 3.5–5.1)
Sodium: 137 mmol/L (ref 135–145)
Total Bilirubin: 0.6 mg/dL (ref 0.3–1.2)
Total Protein: 6.8 g/dL (ref 6.5–8.1)

## 2021-01-11 LAB — CBC WITH DIFFERENTIAL/PLATELET
Abs Immature Granulocytes: 0.08 10*3/uL — ABNORMAL HIGH (ref 0.00–0.07)
Basophils Absolute: 0 10*3/uL (ref 0.0–0.1)
Basophils Relative: 0 %
Eosinophils Absolute: 0 10*3/uL (ref 0.0–0.5)
Eosinophils Relative: 0 %
HCT: 44.3 % (ref 39.0–52.0)
Hemoglobin: 15.2 g/dL (ref 13.0–17.0)
Immature Granulocytes: 1 %
Lymphocytes Relative: 9 %
Lymphs Abs: 0.6 10*3/uL — ABNORMAL LOW (ref 0.7–4.0)
MCH: 30.7 pg (ref 26.0–34.0)
MCHC: 34.3 g/dL (ref 30.0–36.0)
MCV: 89.5 fL (ref 80.0–100.0)
Monocytes Absolute: 0.5 10*3/uL (ref 0.1–1.0)
Monocytes Relative: 6 %
Neutro Abs: 6.2 10*3/uL (ref 1.7–7.7)
Neutrophils Relative %: 84 %
Platelets: 228 10*3/uL (ref 150–400)
RBC: 4.95 MIL/uL (ref 4.22–5.81)
RDW: 13.4 % (ref 11.5–15.5)
WBC: 7.4 10*3/uL (ref 4.0–10.5)
nRBC: 0 % (ref 0.0–0.2)

## 2021-01-11 LAB — C-REACTIVE PROTEIN: CRP: 1.8 mg/dL — ABNORMAL HIGH (ref <1.0)

## 2021-01-11 LAB — GLUCOSE, CAPILLARY
Glucose-Capillary: 256 mg/dL — ABNORMAL HIGH (ref 70–99)
Glucose-Capillary: 217 mg/dL — ABNORMAL HIGH (ref 70–99)

## 2021-01-11 LAB — HEMOGLOBIN A1C
Hgb A1c MFr Bld: 6.8 % — ABNORMAL HIGH (ref 4.8–5.6)
Mean Plasma Glucose: 148.46 mg/dL

## 2021-01-11 MED ORDER — INSULIN ASPART 100 UNIT/ML IJ SOLN
0.0000 [IU] | Freq: Three times a day (TID) | INTRAMUSCULAR | Status: DC
  Administered 2021-01-11 – 2021-01-12 (×2): 3 [IU] via SUBCUTANEOUS
  Filled 2021-01-11 (×2): qty 1

## 2021-01-11 NOTE — Progress Notes (Signed)
PROGRESS NOTE    Jared Duncan  [id number] DOB: 12-19-1970 DOA: 01/08/2021 PCP: Albina Billet, MD   Brief Narrative: 50 year old with no significant past medical history, who presents with worsening COVID symptoms.  Patient tested positive for COVID 8 days prior to admission.  He was treated as an outpatient with azithromycin, Medrol Dosepak.  He completed treatment, he continued to have worsening of symptoms.  Evaluation in the ED patient was found to be hypoxic oxygen saturation 88 on room air, febrile with temperature 101.  Chest x-ray; show bilateral multifocal infiltrates consistent with COVID-pneumonia.    Assessment & Plan:   Active Problems:   Acute hypoxemic respiratory failure due to COVID-19 (HCC)  1-Acute hypoxic respiratory failure, multifocal pneumonia secondary to COVID-19 infection -Presented with worsening shortness of breath, chest x-ray with bilateral infiltrate, hypoxic on presentation and febrile. -Continue with Remdesivir day 4 and Solu-Medrol.  -Continue with Zinc, Vitamin C.  -Lovenox for DVT prophylaxis.  Prone position  -Continue with oxygen supplementation.  [place]    01/08/21 2044 01/09/21 0540 01/10/21 0553 01/11/21 0511  DDIMER 0.77*  --   --   --   CRP 5.4* 5.5* 2.6* 1.8*  Improving. Plan to try to wean off oxygen.    No results found for: SARSCOV2NAA   Estimated body mass index is 32.98 kg/m as calculated from the following:   Height as of this encounter: 6' (1.829 m).   Weight as of this encounter: 110.3 kg.   DVT prophylaxis: Lovenox Code Status: Full code Family Communication: care discussed with patient Disposition Plan:  Status is: Inpatient  Remains inpatient appropriate because:IV treatments appropriate due to intensity of illness or inability to take PO   Dispo: The patient is from: Home              Anticipated d/c is to: Home              Patient currently is not medically stable to  d/c.plan to discharge 5/22   Difficult to place patient No        Consultants:   None  Procedures:   None  Antimicrobials:    Subjective: He had difficult night. He get worsening cough and dyspnea at night when ly down .   Objective: Vitals:   01/10/21 2109 01/10/21 2340 01/11/21 0804 01/11/21 1112  BP: 115/67 117/69 108/65 116/67  Pulse: (!) 59 (!) 54 (!) 53 (!) 57  Resp: 20 16 20 18   Temp: 98.6 F (37 C) 98 F (36.7 C) 97.9 F (36.6 C) 98.2 F (36.8 C)  TempSrc: Oral Oral Oral Oral  SpO2: 90% 97% 94% 96%  Weight:      Height:       No intake or output data in the 24 hours ending 01/11/21 1313 Filed Weights   01/08/21 1748 01/08/21 2009  Weight: 109.3 kg 110.3 kg    Examination:  General exam: NAD Respiratory system: BL crackles.  Cardiovascular system: S 1, S 2 RRR Gastrointestinal system: BS present, soft, nt Central nervous system: Non focal.  Extremities: Symmetric power    Data Reviewed: I have personally reviewed following labs and imaging studies  CBC: Recent Labs  Lab 01/08/21 1310 01/09/21 0540 01/10/21 0553 01/11/21 0511  WBC 4.9 5.5 7.2 7.4  NEUTROABS 4.0 4.5 6.1 6.2  HGB 15.8 14.9 14.5 15.2  HCT 46.5 43.5 41.9 44.3  MCV 89.8 90.8 90.3 89.5  PLT 181 172 200 228   Basic  Metabolic Panel: Recent Labs  Lab 01/08/21 1310 01/09/21 0540 01/10/21 0553 01/11/21 0511  NA 133* 137 137 137  K 3.8 4.4 4.2 4.6  CL 96* 102 102 100  CO2 25 24 27 29   GLUCOSE 140* 161* 184* 233*  BUN 17 20 18 20   CREATININE 1.22 0.97 0.84 0.99  CALCIUM 8.8* 8.7* 8.8* 8.8*   GFR: Estimated Creatinine Clearance: 115.8 mL/min (by C-G formula based on SCr of 0.99 mg/dL). Liver Function Tests: Recent Labs  Lab 01/09/21 0540 01/10/21 0553 01/11/21 0511  AST 43* 36 36  ALT 58* 54* 60*  ALKPHOS 53 52 52  BILITOT 0.6 0.6 0.6  PROT 7.2 6.7 6.8  ALBUMIN 3.6 3.3* 3.4*   No results for input(s): LIPASE, AMYLASE in the last 168 hours. No results for  input(s): AMMONIA in the last 168 hours. Coagulation Profile: No results for input(s): INR, PROTIME in the last 168 hours. Cardiac Enzymes: No results for input(s): CKTOTAL, CKMB, CKMBINDEX, TROPONINI in the last 168 hours. BNP (last 3 results) No results for input(s): PROBNP in the last 8760 hours. HbA1C: No results for input(s): HGBA1C in the last 72 hours. CBG: No results for input(s): GLUCAP in the last 168 hours. Lipid Profile: No results for input(s): CHOL, HDL, LDLCALC, TRIG, CHOLHDL, LDLDIRECT in the last 72 hours. Thyroid Function Tests: No results for input(s): TSH, T4TOTAL, FREET4, T3FREE, THYROIDAB in the last 72 hours. Anemia Panel: No results for input(s): VITAMINB12, FOLATE, FERRITIN, TIBC, IRON, RETICCTPCT in the last 72 hours. Sepsis Labs: Recent Labs  Lab 01/08/21 1344 01/08/21 2044  LATICACIDVEN 1.5 2.6*    Recent Results (from the past 240 hour(s))  Culture, blood (Routine X 2) w Reflex to ID Panel     Status: None (Preliminary result)   Collection Time: 01/08/21  8:44 PM   Specimen: BLOOD  Result Value Ref Range Status   Specimen Description BLOOD BLOOD LEFT HAND  Final   Special Requests   Final    BOTTLES DRAWN AEROBIC AND ANAEROBIC Blood Culture adequate volume   Culture   Final    NO GROWTH 3 DAYS Performed at [facility], [address] [place], [place] [address]    Report Status PENDING  Incomplete  Culture, blood (Routine X 2) w Reflex to ID Panel     Status: None (Preliminary result)   Collection Time: 01/08/21  8:44 PM   Specimen: BLOOD  Result Value Ref Range Status   Specimen Description BLOOD LEFT ANTECUBITAL  Final   Special Requests   Final    BOTTLES DRAWN AEROBIC AND ANAEROBIC Blood Culture adequate volume   Culture   Final    NO GROWTH 3 DAYS Performed at [facility], [address], [place], [place] [address]    Report Status PENDING  Incomplete         Radiology Studies: No results  found.      Scheduled Meds: . albuterol  2 puff Inhalation Q6H  . vitamin C  500 mg Oral Daily  . enoxaparin (LOVENOX) injection  0.5 mg/kg Subcutaneous Q24H  . escitalopram  5 mg Oral Daily  . feeding supplement (GLUCERNA SHAKE)  237 mL Oral BID BM  . insulin aspart  0-6 Units Subcutaneous TID WC  . multivitamin with minerals  1 tablet Oral Daily  . predniSONE  50 mg Oral Daily  . zinc sulfate  220 mg Oral Daily   Continuous Infusions: . remdesivir 100 mg in NS 100 mL 100 mg (  01/11/21 0917)     LOS: 3 days    Time spent: 35 minutes    Cledis Sohn A Hanh Kertesz, MD [facility]   If 7PM-7AM, please contact night-coverage [email or web address]  01/11/2021, 1:13 PM

## 2021-01-12 DIAGNOSIS — J1282 Pneumonia due to coronavirus disease 2019: Secondary | ICD-10-CM | POA: Diagnosis not present

## 2021-01-12 DIAGNOSIS — U071 COVID-19: Secondary | ICD-10-CM | POA: Diagnosis not present

## 2021-01-12 LAB — CBC WITH DIFFERENTIAL/PLATELET
Abs Immature Granulocytes: 0.15 10*3/uL — ABNORMAL HIGH (ref 0.00–0.07)
Basophils Absolute: 0 10*3/uL (ref 0.0–0.1)
Basophils Relative: 0 %
Eosinophils Absolute: 0 10*3/uL (ref 0.0–0.5)
Eosinophils Relative: 0 %
HCT: 44.8 % (ref 39.0–52.0)
Hemoglobin: 15.5 g/dL (ref 13.0–17.0)
Immature Granulocytes: 2 %
Lymphocytes Relative: 9 %
Lymphs Abs: 0.7 10*3/uL (ref 0.7–4.0)
MCH: 30.9 pg (ref 26.0–34.0)
MCHC: 34.6 g/dL (ref 30.0–36.0)
MCV: 89.2 fL (ref 80.0–100.0)
Monocytes Absolute: 0.6 10*3/uL (ref 0.1–1.0)
Monocytes Relative: 7 %
Neutro Abs: 6.6 10*3/uL (ref 1.7–7.7)
Neutrophils Relative %: 82 %
Platelets: 256 10*3/uL (ref 150–400)
RBC: 5.02 MIL/uL (ref 4.22–5.81)
RDW: 13.3 % (ref 11.5–15.5)
WBC: 8.1 10*3/uL (ref 4.0–10.5)
nRBC: 0 % (ref 0.0–0.2)

## 2021-01-12 LAB — COMPREHENSIVE METABOLIC PANEL
ALT: 78 U/L — ABNORMAL HIGH (ref 0–44)
AST: 38 U/L (ref 15–41)
Albumin: 3.4 g/dL — ABNORMAL LOW (ref 3.5–5.0)
Alkaline Phosphatase: 51 U/L (ref 38–126)
Anion gap: 11 (ref 5–15)
BUN: 23 mg/dL — ABNORMAL HIGH (ref 6–20)
CO2: 28 mmol/L (ref 22–32)
Calcium: 8.9 mg/dL (ref 8.9–10.3)
Chloride: 99 mmol/L (ref 98–111)
Creatinine, Ser: 0.93 mg/dL (ref 0.61–1.24)
GFR, Estimated: >60 mL/min (ref >60)
Glucose, Bld: 224 mg/dL — ABNORMAL HIGH (ref 70–99)
Potassium: 4.5 mmol/L (ref 3.5–5.1)
Sodium: 138 mmol/L (ref 135–145)
Total Bilirubin: 0.6 mg/dL (ref 0.3–1.2)
Total Protein: 6.8 g/dL (ref 6.5–8.1)

## 2021-01-12 LAB — C-REACTIVE PROTEIN: CRP: 0.9 mg/dL (ref <1.0)

## 2021-01-12 MED ORDER — ALBUTEROL SULFATE HFA 108 (90 BASE) MCG/ACT IN AERS: 2.0000 | INHALATION_SPRAY | Freq: Four times a day (QID) | RESPIRATORY_TRACT | 0 refills | Status: DC

## 2021-01-12 MED ORDER — GLIPIZIDE 5 MG PO TABS: 2.5000 mg | ORAL_TABLET | Freq: Two times a day (BID) | ORAL | 1 refills | Status: DC

## 2021-01-12 MED ORDER — PREDNISONE 20 MG PO TABS: ORAL_TABLET | ORAL | 0 refills | Status: DC

## 2021-01-12 MED ORDER — ZINC SULFATE 220 (50 ZN) MG PO CAPS: 220.0000 mg | ORAL_CAPSULE | Freq: Every day | ORAL | 0 refills | Status: DC

## 2021-01-12 MED ORDER — BLOOD GLUCOSE METER KIT: PACK | 0 refills | Status: DC

## 2021-01-12 MED ORDER — HYDROCOD POLST-CPM POLST ER 10-8 MG/5ML PO SUER: 5.0000 mL | Freq: Two times a day (BID) | ORAL | 0 refills | Status: DC | PRN

## 2021-01-12 MED ORDER — ASCORBIC ACID 500 MG PO TABS: 500.0000 mg | ORAL_TABLET | Freq: Every day | ORAL | 0 refills | Status: DC

## 2021-01-12 NOTE — Progress Notes (Signed)
O2 sat 97% on 2L O2 while at rest.  Ambulated in room on RA- O2 sat dropped to 86% with dyspnea.  Sats recovered to 96% with 2L O2.

## 2021-01-12 NOTE — Discharge Summary (Signed)
Physician Discharge Summary  Jared Duncan HUT:[id number] DOB: September 28, 1970 DOA: 01/08/2021  PCP: Albina Billet, MD  Admit date: 01/08/2021 Discharge date: 01/12/2021  Admitted From: Home  Disposition: Home   Recommendations for Outpatient Follow-up:  1. Follow up with PCP in 1-2 weeks 2. Please obtain BMP/CBC in one week 3. Needs to follow up with PCP for further management of DM 4. Needs further assessment of oxygen needs.    Home Health: None  Discharge Condition: Stable.  CODE STATUS: Full Code.  Diet recommendation: Carb Modified  Brief/Interim Summary: 50 year old with no significant past medical history, who presents with worsening COVID symptoms.  Patient tested positive for COVID 8 days prior to admission.  He was treated as an outpatient with azithromycin, Medrol Dosepak.  He completed treatment, he continued to have worsening of symptoms.  Evaluation in the ED patient was found to be hypoxic oxygen saturation 88 on room air, febrile with temperature 101.  Chest x-ray; show bilateral multifocal infiltrates consistent with COVID-pneumonia.   1-Acute hypoxic respiratory failure, multifocal pneumonia secondary to COVID-19 infection -Presented with worsening shortness of breath, chest x-ray with bilateral infiltrate, hypoxic on presentation and febrile. -Completed Remdesivir day 5 and received Solu-Medrol. Plan to discharge on 5 days of dexamethasone.  -Continue with Zinc, Vitamin C.  -Lovenox for DVT prophylaxis.  Prone position  -Continue with oxygen supplementation. He will need Home oxygen at discharge. Oxygen on ambulation 86 %.  COVID-19 Labs  Recent Labs (last 2 labs)         Recent Labs    01/08/21 2044 01/09/21 0540 01/10/21 0553 01/11/21 0511  DDIMER 0.77*  --   --   --   CRP 5.4* 5.5* 2.6* 1.8*    Improving. Plan to try to wean off oxygen.    Recent Labs   DM; type 2, hyperglycemia  HB;A1c at 6.8 CBG 200.  Plan to discharge on low dose  glipizide. Patient will be discharge on dexamethasone for covid as well, which affect blood sugar. I have provide prescription for Meter.    Discharge Diagnoses:  Active Problems:   Acute hypoxemic respiratory failure due to COVID-19 [facility])    Discharge Instructions  Discharge Instructions    Diet - low sodium heart healthy   Complete by: As directed    Increase activity slowly   Complete by: As directed      Allergies as of 01/12/2021   Not on File     Medication List    STOP taking these medications   azithromycin 250 MG tablet Commonly known as: ZITHROMAX   dexamethasone 4 MG tablet Commonly known as: DECADRON     TAKE these medications   albuterol 108 (90 Base) MCG/ACT inhaler Commonly known as: VENTOLIN HFA Inhale 2 puffs into the lungs every 6 (six) hours.   ascorbic acid 500 MG tablet Commonly known as: VITAMIN C Take 1 tablet (500 mg total) by mouth daily.   benzonatate 200 MG capsule Commonly known as: TESSALON Take 200 mg by mouth every 12 (twelve) hours as needed.   blood glucose meter kit and supplies Dispense based on patient and insurance preference. Use up to four times daily as directed. (FOR ICD-10 E10.9, E11.9).  Check blood sugar three time a day before meals.   chlorpheniramine-HYDROcodone 10-8 MG/5ML Suer Commonly known as: TUSSIONEX Take 5 mLs by mouth every 12 (twelve) hours as needed for cough.   escitalopram 5 MG tablet Commonly known as: LEXAPRO Take 5 mg by mouth daily.  folic acid 1 MG tablet Commonly known as: FOLVITE Take 1 mg by mouth 2 (two) times daily.   glipiZIDE 5 MG tablet Commonly known as: GLUCOTROL Take 0.5 tablets (2.5 mg total) by mouth 2 (two) times daily before a meal.   predniSONE 20 MG tablet Commonly known as: DELTASONE Take 2 tablets for 4 days.   zinc sulfate 220 (50 Zn) MG capsule Take 1 capsule (220 mg total) by mouth daily.            Durable Medical Equipment  (From admission, onward)          Start     Ordered   01/12/21 1009  For home use only DME oxygen  Once       Question Answer Comment  Length of Need 6 Months   Mode or (Route) Nasal cannula   Liters per Minute 2   Frequency Continuous (stationary and portable oxygen unit needed)   Oxygen delivery system Gas      01/12/21 1008          Follow-up Information    Albina Billet, MD Follow up in 1 week(s).   Specialty: Internal Medicine Why: You need follow up for your blood sugar, Contact information: [address]   [place] [phone]              Not on File  Consultations: None  Procedures/Studies: DG Chest Portable 1 View  Result Date: 01/08/2021 CLINICAL DATA:  Dyspnea and cough.  COVID-19 pneumonia. EXAM: PORTABLE CHEST 1 VIEW COMPARISON:  None. FINDINGS: The lung volumes are low. Diffuse reticular and nodular interstitial opacities are noted throughout both lungs compatible with history of COVID pneumonia. No signs of pleural effusion. No pneumothorax or atelectasis identified. IMPRESSION: Diffuse bilateral reticular and nodular interstitial opacities compatible with history of COVID pneumonia. Electronically Signed   By: Kerby Moors M.D.   On: 01/08/2021 13:25    Subjective: He had better night. Breathing well, still with cough.    Discharge Exam: Vitals:   01/12/21 1000 01/12/21 1002  BP:    Pulse:    Resp:    Temp:    SpO2: (!) 86% 94%     General: Pt is alert, awake, not in acute distress Cardiovascular: RRR, S1/S2 +, no rubs, no gallops Respiratory: CTA bilaterally, no wheezing, no rhonchi Abdominal: Soft, NT, ND, bowel sounds + Extremities: no edema, no cyanosis    The results of significant diagnostics from this hospitalization (including imaging, microbiology, ancillary and laboratory) are listed below for reference.     Microbiology: Recent Results (from the past 240 hour(s))  Culture, blood (Routine X 2) w Reflex to ID Panel      Status: None (Preliminary result)   Collection Time: 01/08/21  8:44 PM   Specimen: BLOOD  Result Value Ref Range Status   Specimen Description BLOOD BLOOD LEFT HAND  Final   Special Requests   Final    BOTTLES DRAWN AEROBIC AND ANAEROBIC Blood Culture adequate volume   Culture   Final    NO GROWTH 4 DAYS Performed at [facility], [address] [place], [place] [address]    Report Status PENDING  Incomplete  Culture, blood (Routine X 2) w Reflex to ID Panel     Status: None (Preliminary result)   Collection Time: 01/08/21  8:44 PM   Specimen: BLOOD  Result Value Ref Range Status   Specimen Description BLOOD LEFT ANTECUBITAL  Final  Special Requests   Final    BOTTLES DRAWN AEROBIC AND ANAEROBIC Blood Culture adequate volume   Culture   Final    NO GROWTH 4 DAYS Performed at [facility], [address] [place], [place] [address]    Report Status PENDING  Incomplete     Labs: BNP (last 3 results) No results for input(s): BNP in the last 8760 hours. Basic Metabolic Panel: Recent Labs  Lab 01/08/21 1310 01/09/21 0540 01/10/21 0553 01/11/21 0511 01/12/21 0552  NA 133* 137 137 137 138  K 3.8 4.4 4.2 4.6 4.5  CL 96* 102 102 100 99  CO2 25 24 27 29 28   GLUCOSE 140* 161* 184* 233* 224*  BUN 17 20 18 20  23*  CREATININE 1.22 0.97 0.84 0.99 0.93  CALCIUM 8.8* 8.7* 8.8* 8.8* 8.9   Liver Function Tests: Recent Labs  Lab 01/09/21 0540 01/10/21 0553 01/11/21 0511 01/12/21 0552  AST 43* 36 36 38  ALT 58* 54* 60* 78*  ALKPHOS 53 52 52 51  BILITOT 0.6 0.6 0.6 0.6  PROT 7.2 6.7 6.8 6.8  ALBUMIN 3.6 3.3* 3.4* 3.4*   No results for input(s): LIPASE, AMYLASE in the last 168 hours. No results for input(s): AMMONIA in the last 168 hours. CBC: Recent Labs  Lab 01/08/21 1310 01/09/21 0540 01/10/21 0553 01/11/21 0511 01/12/21 0552  WBC 4.9 5.5 7.2 7.4 8.1  NEUTROABS 4.0 4.5 6.1 6.2 6.6  HGB 15.8 14.9 14.5 15.2 15.5  HCT 46.5 43.5 41.9 44.3 44.8   MCV 89.8 90.8 90.3 89.5 89.2  PLT 181 172 200 228 256   Cardiac Enzymes: No results for input(s): CKTOTAL, CKMB, CKMBINDEX, TROPONINI in the last 168 hours. BNP: Invalid input(s): POCBNP CBG: Recent Labs  Lab 01/11/21 1707 01/11/21 2120  GLUCAP 256* 217*   D-Dimer No results for input(s): DDIMER in the last 72 hours. Hgb A1c Recent Labs    01/11/21 0511  HGBA1C 6.8*   Lipid Profile No results for input(s): CHOL, HDL, LDLCALC, TRIG, CHOLHDL, LDLDIRECT in the last 72 hours. Thyroid function studies No results for input(s): TSH, T4TOTAL, T3FREE, THYROIDAB in the last 72 hours.  Invalid input(s): FREET3 Anemia work up No results for input(s): VITAMINB12, FOLATE, FERRITIN, TIBC, IRON, RETICCTPCT in the last 72 hours. Urinalysis No results found for: COLORURINE, APPEARANCEUR, [place], [place], [place], [place], [place], [place], [place], UROBILINOGEN, NITRITE, LEUKOCYTESUR Sepsis Labs Invalid input(s): PROCALCITONIN,  WBC,  LACTICIDVEN Microbiology Recent Results (from the past 240 hour(s))  Culture, blood (Routine X 2) w Reflex to ID Panel     Status: None (Preliminary result)   Collection Time: 01/08/21  8:44 PM   Specimen: BLOOD  Result Value Ref Range Status   Specimen Description BLOOD BLOOD LEFT HAND  Final   Special Requests   Final    BOTTLES DRAWN AEROBIC AND ANAEROBIC Blood Culture adequate volume   Culture   Final    NO GROWTH 4 DAYS Performed at [facility], [address] [place], [place] [address]    Report Status PENDING  Incomplete  Culture, blood (Routine X 2) w Reflex to ID Panel     Status: None (Preliminary result)   Collection Time: 01/08/21  8:44 PM   Specimen: BLOOD  Result Value Ref Range Status   Specimen Description BLOOD LEFT ANTECUBITAL  Final   Special Requests   Final    BOTTLES DRAWN AEROBIC AND ANAEROBIC Blood Culture adequate volume   Culture   Final    NO  GROWTH 4 DAYS Performed at [facility], [place], [place] [address]    Report Status PENDING  Incomplete     Time coordinating discharge: 40 minutes  SIGNED:   Elmarie Shiley, MD  [facility]

## 2021-01-12 NOTE — Plan of Care (Signed)

## 2021-01-12 NOTE — TOC Transition Note (Addendum)
Transition of Care [facility]) - CM/SW Discharge Note   Patient Details  Name: Jared Duncan MRN: [id number] Date of Birth: 05-Mar-1971  [facility] of [facility]) CM/SW Contact:  Boris Sharper, LCSW Phone Number: 01/12/2021, 10:30 AM   Clinical Narrative:    Pt medically stable for discharge per MD. Pt will be transported home by his spouse.CSW arranged home O2 with [employer]. O2 to be delivered to pt's room before discharge.   Final next level of care: Home/Self Care Barriers to Discharge: No Barriers Identified   Patient Goals and CMS Choice   CMS Medicare.gov Compare Post Acute Care list provided to:: Patient Choice offered to / list presented to : Patient  Discharge Placement                  Name of family member notified: Levada Dy Patient and family notified of of transfer: 01/12/21  Discharge Plan and Services                DME Arranged: Oxygen DME Agency: Other - Comment ([place]) Date DME Agency Contacted: 01/12/21 Time DME Agency Contacted: [phone] Representative spoke with at DME Agency: [place] Determinants of Health ([place]) Interventions     Readmission Risk Interventions No flowsheet data found.

## 2021-01-12 NOTE — Discharge Instructions (Signed)
You need to Quarantine until June 2.  10 Things You Can Do to Manage Your COVID-19 Symptoms at Home If you have possible or confirmed COVID-19: 1. Stay home except to get medical care. 2. Monitor your symptoms carefully. If your symptoms get worse, call your healthcare provider immediately. 3. Get rest and stay hydrated. 4. If you have a medical appointment, call the healthcare provider ahead of time and tell them that you have or may have COVID-19. 5. For medical emergencies, call 911 and notify the dispatch personnel that you have or may have COVID-19. 6. Cover your cough and sneezes with a tissue or use the inside of your elbow. 7. Wash your hands often with soap and water for at least 20 seconds or clean your hands with an alcohol-based hand sanitizer that contains at least 60% alcohol. 8. As much as possible, stay in a specific room and away from other people in your home. Also, you should use a separate bathroom, if available. If you need to be around other people in or outside of the home, wear a mask. 9. Avoid sharing personal items with other people in your household, like dishes, towels, and bedding. 10. Clean all surfaces that are touched often, like counters, tabletops, and doorknobs. Use household cleaning sprays or wipes according to the label instructions. [email or web address] 03/08/2020 This information is not intended to replace advice given to you by your health care provider. Make sure you discuss any questions you have with your health care provider. Document Revised: 06/24/2020 Document Reviewed: 06/24/2020 Elsevier Patient Education  2021 [employer].

## 2021-01-13 LAB — CULTURE, BLOOD (ROUTINE X 2)
Culture: NO GROWTH
Culture: NO GROWTH
Special Requests: ADEQUATE
Special Requests: ADEQUATE

## 2022-11-24 ENCOUNTER — Other Ambulatory Visit: Payer: Self-pay | Admitting: Internal Medicine

## 2022-11-24 ENCOUNTER — Ambulatory Visit
Admission: RE | Admit: 2022-11-24 | Discharge: 2022-11-24 | Disposition: A | Discharge status: Home / Self Care | Payer: BC Managed Care – PPO | Attending: Family Medicine | Admitting: Family Medicine

## 2022-11-24 ENCOUNTER — Ambulatory Visit
Admission: RE | Admit: 2022-11-24 | Discharge: 2022-11-24 | Disposition: A | Discharge status: Home / Self Care | Payer: BC Managed Care – PPO | Source: Ambulatory Visit | Attending: Internal Medicine | Admitting: Internal Medicine

## 2022-11-24 DIAGNOSIS — R079 Chest pain, unspecified: Secondary | ICD-10-CM | POA: Insufficient documentation

## 2022-11-25 IMAGING — DX DG CHEST 1V PORT
1 series · 1 of 1 positions shown · non-contrast
Comparison: None.

CLINICAL DATA: Dyspnea and cough.  VTPVX-WT pneumonia.

EXAM:
PORTABLE CHEST 1 VIEW

[chest ap]
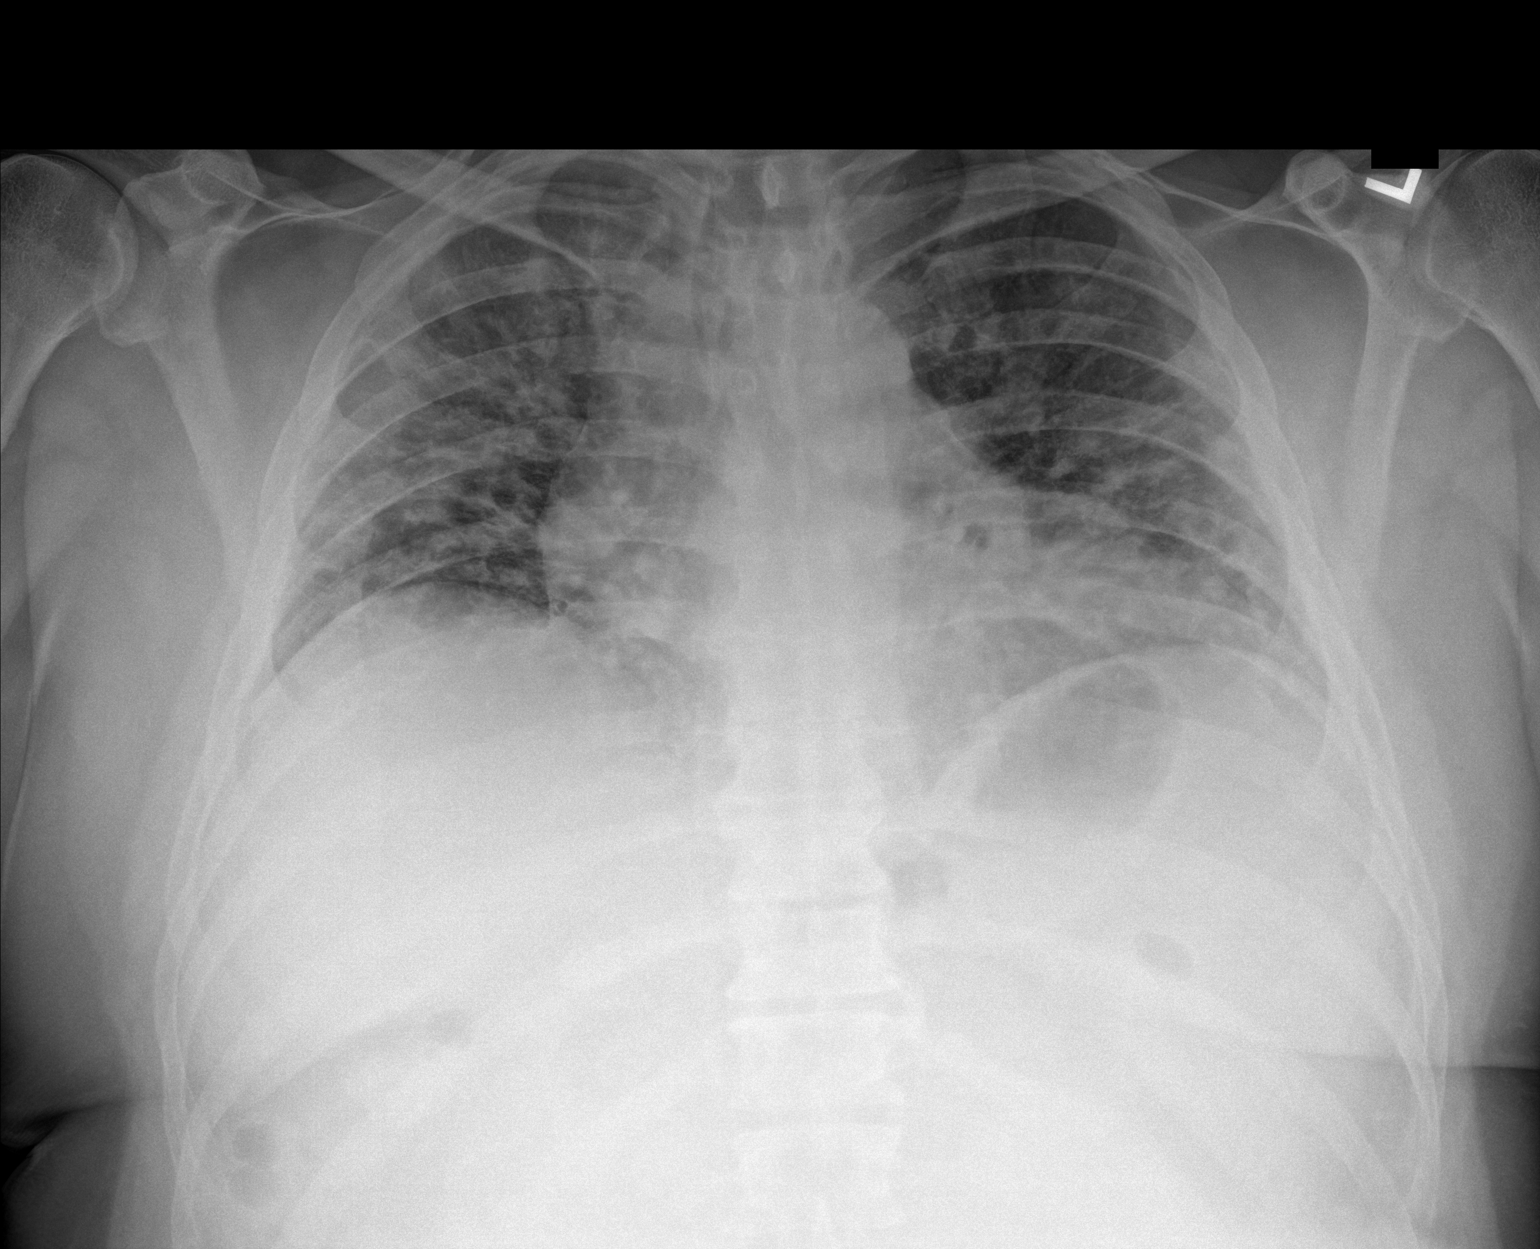

[1 of 1 positions shown; findings below may reference images not displayed]

FINDINGS: The lung volumes are low. Diffuse reticular and nodular interstitial
opacities are noted throughout both lungs compatible with history of
COVID pneumonia. No signs of pleural effusion. No pneumothorax or
atelectasis identified.
IMPRESSION: Diffuse bilateral reticular and nodular interstitial opacities
compatible with history of COVID pneumonia.

## 2022-12-09 ENCOUNTER — Ambulatory Visit: Payer: BC Managed Care – PPO | Admitting: Surgery

## 2022-12-09 ENCOUNTER — Encounter: Payer: Self-pay | Admitting: Surgery

## 2022-12-09 VITALS — BP 110/72 | HR 66 | Temp 97.9°F | Ht 71.0 in | Wt 253.8 lb

## 2022-12-09 DIAGNOSIS — R222 Localized swelling, mass and lump, trunk: Secondary | ICD-10-CM | POA: Diagnosis not present

## 2022-12-09 NOTE — Patient Instructions (Addendum)
Our surgery scheduler Barbara will call you within 24-48 hours to get you scheduled. If you have not heard from her after 48 hours, please call our office. Have the blue sheet available when she calls to write down important information. ? ? ?If you have any concerns or questions, please feel free to call our office. ? ? ?Lipoma Removal ? ?Lipoma removal is a surgical procedure to remove a lipoma, which is a noncancerous (benign) tumor that is made up of fat cells. Most lipomas are small and painless and do not require treatment. They can form in many areas of the body but are most common under the skin of the back, arms, shoulders, buttocks, and thighs. ?You may need lipoma removal if you have a lipoma that is large, growing, or causing discomfort. Lipoma removal may also be done for cosmetic reasons. ?Tell a health care provider about: ?Any allergies you have. ?All medicines you are taking, including vitamins, herbs, eye drops, creams, and over-the-counter medicines. ?Any problems you or family members have had with anesthetic medicines. ?Any bleeding problems you have. ?Any surgeries you have had. ?Any medical conditions you have. ?Whether you are pregnant or may be pregnant. ?What are the risks? ?Generally, this is a safe procedure. However, problems may occur, including: ?Infection. ?Bleeding. ?Scarring. ?Allergic reactions to medicines. ?Damage to nearby structures or organs, such as damage to nerves or blood vessels near the lipoma. ?What happens before the procedure? ?When to Stop Eating and Drinking ?Follow instructions from your health care provider about what you may eat and drink before your procedure. These may include: ?8 hours before your procedure ?Stop eating most foods. Do not eat meat, fried foods, or fatty foods. ?Eat only light foods, such as toast or crackers. ?All liquids are okay except energy drinks and alcohol. ?6 hours before your procedure ?Stop eating. ?Drink only clear liquids, such as  water, clear fruit juice, black coffee, plain tea, and sports drinks. ?Do not drink energy drinks or alcohol. ?2 hours before your procedure ?Stop drinking all liquids. ?You may be allowed to take medicines with small sips of water. ?If you do not follow your health care provider's instructions, your procedure may be delayed or canceled. ?Medicines ?Ask your health care provider about: ?Changing or stopping your regular medicines. This is especially important if you are taking diabetes medicines or blood thinners. ?Taking medicines such as aspirin and ibuprofen. These medicines can thin your blood. Do not take these medicines unless your health care provider tells you to take them. ?Taking over-the-counter medicines, vitamins, herbs, and supplements. ?General instructions ?You will have a physical exam. Your health care provider will check the size of the lipoma and whether it can be removed easily. ?You may have a biopsy and imaging tests, such as X-rays, a CT scan, and an MRI. ?Do not use any products that contain nicotine or tobacco for at least 4 weeks before the procedure. These products include cigarettes, chewing tobacco, and vaping devices, such as e-cigarettes. If you need help quitting, ask your health care provider. ?Ask your health care provider: ?How your surgery site will be marked. ?What steps will be taken to help prevent infection. These may include: ?Washing skin with a germ-killing soap. ?Taking antibiotic medicine. ?If you will be going home right after the procedure, plan to have a responsible adult: ?Take you home from the hospital or clinic. You will not be allowed to drive. ?Care for you for the time you are told. ?What happens during   the procedure? ? ?An IV will be inserted into one of your veins. ?You will be given one or more of the following: ?A medicine to help you relax (sedative). ?A medicine to numb the area (local anesthetic). ?A medicine to make you fall asleep (general  anesthetic). ?A medicine that is injected into an area of your body to numb everything below the injection site (regional anesthetic). ?An incision will be made into the skin over the lipoma or very near the lipoma. The incision may be made in a natural skin line or crease. ?Tissues, nerves, and blood vessels near the lipoma will be moved out of the way. ?The lipoma and the capsule that surrounds it will be separated from the surrounding tissues. ?The lipoma will be removed. ?The incision may be closed with stitches (sutures). ?A bandage (dressing) will be placed over the incision. ?The procedure may vary among health care providers and hospitals. ?What happens after the procedure? ?Your blood pressure, heart rate, breathing rate, and blood oxygen level will be monitored until you leave the hospital or clinic. ?If you were prescribed an antibiotic medicine, use it as told by your health care provider. Do not stop using the antibiotic even if you start to feel better. ?If you were given a sedative during the procedure, it can affect you for several hours. Do not drive or operate machinery until your health care provider says that it is safe. ?Where to find more information ?OrthoInfo: orthoinfo.aaos.org ?Summary ?Before the procedure, follow instructions from your health care provider about eating and drinking, and changing or stopping your regular medicines. This is especially important if you are taking diabetes medicines or blood thinners. ?After the lipoma is removed, the incision may be closed with stitches (sutures) and covered with a bandage (dressing). ?If you were given a sedative during the procedure, it can affect you for several hours. Do not drive or operate machinery until your health care provider says that it is safe. ?This information is not intended to replace advice given to you by your health care provider. Make sure you discuss any questions you have with your health care provider. ?Document  Revised: 08/29/2021 Document Reviewed: 08/29/2021 ?Elsevier Patient Education ? 2023 Elsevier Inc. ? ?

## 2022-12-10 ENCOUNTER — Telehealth: Payer: Self-pay | Admitting: Surgery

## 2022-12-10 NOTE — Telephone Encounter (Signed)
Patient has been advised of Pre-Admission date/time, and Surgery date at [facility].  Surgery Date: 12/24/22 Preadmission Testing Date: 12/15/22 (phone 1p-4p)  Patient has been made aware to call [phone], between 1-3:00pm the day before surgery, to find out what time to arrive for surgery.

## 2022-12-10 NOTE — Progress Notes (Signed)
Patient ID: Jared Duncan, male   DOB: 05-Jul-1971, 52 y.o.   MRN: [id number]  HPI Jared Duncan is a 52 y.o. male Seen in consultation at the request of Dr. Arlana Pouch for a right chest wall lesion.  He reports she has been for the last few Months.  He develops intermittent sharp pain that are mild to moderate.  It is tender when he presses on the area and if he sleeps on that side of his chest.  The pain is nonradiated.  Nonspecific alleviating factors.  He did have a chest x-ray that I have personally reviewed showing no evidence of acute abnormalities. He is able to perform more than 4 METS of activity without any shortness of breath or chest pain.  Recent CBC and CMP is completely normal.  He does have a history of gastric cancer and was concerned about potentially the lesion being cancerous. He works as a Visual merchandiser at a [employer] He denies any fevers any chills any shortness of breath last any weight loss any B type symptoms. He denies any trauma  HPI  Past Medical History:  Diagnosis Date   Depression     History reviewed. No pertinent surgical history.  History reviewed. No pertinent family history.  Social History Social History   Tobacco Use   Smoking status: Never   Smokeless tobacco: Never  Vaping Use   Vaping Use: Never used    Not on File  Current Outpatient Medications  Medication Sig Dispense Refill   escitalopram (LEXAPRO) 5 MG tablet Take 5 mg by mouth daily.     No current facility-administered medications for this visit.     Review of Systems Full ROS  was asked and was negative except for the information on the HPI  Physical Exam Blood pressure 110/72, pulse 66, temperature 97.9 F (36.6 C), temperature source Oral, height  (1.803 m), weight 253 lb 12.8 oz (115.1 kg), SpO2 96 %. CONSTITUTIONAL: NAD. EYES: Pupils are equal, round, and reactive to light, Sclera are non-icteric. EARS, NOSE, MOUTH AND THROAT: The  oropharynx is clear. The oral mucosa is pink and moist. Hearing is intact to voice. LYMPH NODES:  Lymph nodes in the neck are normal. RESPIRATORY:  Lungs are clear. There is normal respiratory effort, with equal breath sounds bilaterally, and without pathologic use of accessory muscles. There is evidence of a 2 to 2.5 cm right lateral chest wall lesion within the subcutaneous tissue.  It is tender to palpation.  It is soft and mobile.  This is consistent with lipoma CARDIOVASCULAR: Heart is regular without murmurs, gallops, or rubs. GI: The abdomen is  soft, nontender, and nondistended. There are no palpable masses. There is no hepatosplenomegaly. There are normal bowel sounds in all quadrants. GU: Rectal deferred.   MUSCULOSKELETAL: Normal muscle strength and tone. No cyanosis or edema.   SKIN: Turgor is good and there are no pathologic skin lesions or ulcers. NEUROLOGIC: Motor and sensation is grossly normal. Cranial nerves are grossly intact. PSYCH:  Oriented to person, place and time. Affect is normal.  Data Reviewed  I have personally reviewed the patient's imaging, laboratory findings and medical records.    Assessment/Plan 52 year old male with a right-sided chest wall nodule consistent with either a lipoma or epidermal inclusion cyst.  Discussed with the patient in detail I do think that excision is indicated.  Patient was given the options of doing it under local anesthetic here in the office versus in the OR.  Patient does not do well with needles and wishes to have this excised in the OR.  Procedure discussed with the patient in detail.  Risk, benefits and possible occasions including but not limited to: Bleeding, infection, chronic pain, wound issues.  He understands and wished to proceed Note that I spent 45 minutes in this encounter including personally reviewing imaging studies, coordinating his care, placing orders and performing appropriate documentation.  A copy of this report was  sent to the referring provider  Sterling Big, MD FACS General Surgeon 12/10/2022, 7:48 AM

## 2022-12-14 ENCOUNTER — Telehealth: Payer: Self-pay | Admitting: Surgery

## 2022-12-14 MED ORDER — ALPRAZOLAM 0.5 MG PO TABS: 0.5000 mg | ORAL_TABLET | Freq: Every evening | ORAL | 0 refills | Status: DC | PRN

## 2022-12-14 NOTE — Telephone Encounter (Signed)
Patient cancelled surgery on May 2nd for his lipoma excision.  Was going to cost him too much.  He has decided to have this done in office instead.  He is scheduled for in office procedure on Dec 23, 2022.  Was informed that if he decided to do this, that he can perhaps be prescribed valium to take prior to this procedure.  Patient is informed and aware that if doing this will still need a driver with him.  Patient verbalized understanding. Can we get Valium called in for him please and let patient know when ready. Thank you.

## 2022-12-14 NOTE — Addendum Note (Signed)
Addended by: Sterling Big F on: 12/14/2022 11:16 AM   Modules accepted: Orders

## 2022-12-15 ENCOUNTER — Inpatient Hospital Stay: Admission: RE | Admit: 2022-12-15 | Payer: BC Managed Care – PPO | Source: Ambulatory Visit

## 2022-12-23 ENCOUNTER — Ambulatory Visit (INDEPENDENT_AMBULATORY_CARE_PROVIDER_SITE_OTHER): Payer: BC Managed Care – PPO | Admitting: Surgery

## 2022-12-23 ENCOUNTER — Other Ambulatory Visit: Payer: Self-pay | Admitting: Surgery

## 2022-12-23 ENCOUNTER — Encounter: Payer: Self-pay | Admitting: Surgery

## 2022-12-23 VITALS — BP 115/77 | HR 80 | Temp 98.0°F | Ht 71.0 in | Wt 247.0 lb

## 2022-12-23 DIAGNOSIS — D171 Benign lipomatous neoplasm of skin and subcutaneous tissue of trunk: Secondary | ICD-10-CM | POA: Diagnosis not present

## 2022-12-23 DIAGNOSIS — R222 Localized swelling, mass and lump, trunk: Secondary | ICD-10-CM

## 2022-12-23 NOTE — Patient Instructions (Addendum)
Today we have removed a Lipoma in our office. Please see information below regarding this type of tumor.  You are free to shower tomorrow, do not soak the area.   You have glue on your skin and sutures under the skin. The glue will come off on it's own in 10-14 days. You may shower normally until this occurs but do not submerge.  Please use Tylenol or Ibuprofen for pain as needed. You may use ice to the area 3-4 times today and tomorrow for any achiness.   We will call you with your results. You may continue your regular activities right away but if you are having pain while doing something, stop what you are doing and try this activity once again in 3 days. Please call our office with any questions or concerns prior to your appointment.  You will need to use sunscreen for the next year on the area to minimize altered pigmentation of the site.   Lipoma Removal Lipoma removal is a surgical procedure to remove a noncancerous (benign) tumor that is made up of fat cells (lipoma). Most lipomas are small and painless and do not require treatment. They can form in many areas of the body but are most common under the skin of the back, shoulders, arms, and thighs. You may need lipoma removal if you have a lipoma that is large, growing, or causing discomfort. Lipoma removal may also be done for cosmetic reasons. Tell a health care provider about: Any allergies you have. All medicines you are taking, including vitamins, herbs, eye drops, creams, and over-the-counter medicines. Any problems you or family members have had with anesthetic medicines. Any blood disorders you have. Any surgeries you have had. Any medical conditions you have. Whether you are pregnant or may be pregnant. What are the risks? Generally, this is a safe procedure. However, problems may occur, including: Infection. Bleeding. Allergic reactions to medicines. Damage to nerves or blood vessels near the  lipoma. Scarring.  Medicines Ask your health care provider about: Changing or stopping your regular medicines. This is especially important if you are taking diabetes medicines or blood thinners. Taking medicines such as aspirin and ibuprofen. These medicines can thin your blood. Do not take these medicines before your procedure if your health care provider instructs you not to. You may be given antibiotic medicine to help prevent infection. General instructions Ask your health care provider how your surgical site will be marked or identified. You will have a physical exam. Your health care provider will check the size of the lipoma and whether it can be moved easily.  What happens during the procedure? To reduce your risk of infection: Your health care team will wash or sanitize their hands. Your skin will be washed with surgical soap. You will be given the following: A medicine to numb the area (local anesthetic). An incision will be made over the lipoma or very near the lipoma. The incision may be made in a natural skin line or crease. Tissues, nerves, and blood vessels near the lipoma will be moved out of the way. The lipoma and the capsule that surrounds it will be separated from the surrounding tissues. The lipoma will be removed. The incision may be closed with stitches and surgical glue

## 2022-12-24 ENCOUNTER — Encounter: Admission: RE | Payer: Self-pay | Source: Home / Self Care

## 2022-12-24 ENCOUNTER — Ambulatory Visit: Admission: RE | Admit: 2022-12-24 | Payer: BC Managed Care – PPO | Source: Home / Self Care | Admitting: Surgery

## 2022-12-24 SURGERY — EXCISION LIPOMA
Anesthesia: General | Laterality: Right

## 2022-12-25 NOTE — Progress Notes (Signed)
DIAGNOSIS Symptomatic soft tissue mass Right chest wall  PROCEDURES 1.  Excision of lipoma 3.2 cm Right Anterolateral chest wall  2.  Intermediate closure measuring 3.2 cm incision  ANESTHESIA: Lidocaine 1% with epinephrine 10cc  EBL: Minimal  FINDINGS: Lipoma  After informed consent was obtained the patient was prepped and draped in the usual sterile fashion.  Lidocaine 1% was injected over the area of interest.  15 blade knife used to create an incision and the subcutaneous tissue was dissected free with hemostats.  The mass was dissected free from adjacent structures using Metzenbaum scissors.  It was sent for permanent pathology.  Hemostasis obtained with pressure.  The wound was closed in a 2 layer fashion with dermal layer using interrupted 3-0 Vicryl.  The skin was closed in a subcuticular fashion using 4-0 Monocryl.  Dermabond was applied.  No complications. The  patient tolerated procedure well

## 2022-12-28 ENCOUNTER — Telehealth: Payer: Self-pay

## 2022-12-28 NOTE — Telephone Encounter (Signed)
-----   Message from Leafy Ro, MD sent at 12/28/2022  9:28 AM EDT ----- Please let him know , path shows benign lipoma ----- Message ----- From: Interface, Lab In [facility] Sent: 12/27/2022   7:41 PM EDT To: Leafy Ro, MD

## 2022-12-28 NOTE — Telephone Encounter (Signed)
Notified patient as instructed, patient pleased. No follow up appointment needed.

## 2024-01-13 ENCOUNTER — Telehealth: Payer: Self-pay

## 2024-01-13 NOTE — Telephone Encounter (Signed)
 Copied from CRM [id number]. Topic: Appointments - Transfer of Care >> Jan 13, 2024 10:10 AM Clyde Darling P wrote: Pt is requesting to transfer FROM: [facility]) Pt is requesting to transfer TO: Dr. Thalia Filler Reason for requested transfer: PCP is retiring  It is the responsibility of the team the patient would like to transfer to (Dr. Thalia Filler) to reach out to the patient if for any reason this transfer is not acceptable.

## 2024-01-13 NOTE — Telephone Encounter (Signed)
 Copied from CRM [id number]. Topic: General - Other >> Jan 13, 2024 10:29 AM Marissa P wrote: Reason for CRM: patient called to inform office that Dr.Tate office will be faxing over the medical records and wanted to make us  aware of that and if someone can follow up with patient once received please

## 2024-01-20 ENCOUNTER — Ambulatory Visit (INDEPENDENT_AMBULATORY_CARE_PROVIDER_SITE_OTHER): Admitting: Internal Medicine

## 2024-01-20 ENCOUNTER — Encounter: Payer: Self-pay | Admitting: Internal Medicine

## 2024-01-20 VITALS — BP 110/68 | Ht 71.0 in | Wt 254.4 lb

## 2024-01-20 DIAGNOSIS — M25512 Pain in left shoulder: Secondary | ICD-10-CM | POA: Diagnosis not present

## 2024-01-20 DIAGNOSIS — F411 Generalized anxiety disorder: Secondary | ICD-10-CM | POA: Insufficient documentation

## 2024-01-20 DIAGNOSIS — E66812 Obesity, class 2: Secondary | ICD-10-CM

## 2024-01-20 DIAGNOSIS — E119 Type 2 diabetes mellitus without complications: Secondary | ICD-10-CM

## 2024-01-20 DIAGNOSIS — E669 Obesity, unspecified: Secondary | ICD-10-CM | POA: Insufficient documentation

## 2024-01-20 DIAGNOSIS — Z713 Dietary counseling and surveillance: Secondary | ICD-10-CM | POA: Insufficient documentation

## 2024-01-20 DIAGNOSIS — R0683 Snoring: Secondary | ICD-10-CM | POA: Insufficient documentation

## 2024-01-20 DIAGNOSIS — R7309 Other abnormal glucose: Secondary | ICD-10-CM | POA: Insufficient documentation

## 2024-01-20 DIAGNOSIS — G4733 Obstructive sleep apnea (adult) (pediatric): Secondary | ICD-10-CM | POA: Diagnosis not present

## 2024-01-20 DIAGNOSIS — F32A Depression, unspecified: Secondary | ICD-10-CM | POA: Insufficient documentation

## 2024-01-20 DIAGNOSIS — E1169 Type 2 diabetes mellitus with other specified complication: Secondary | ICD-10-CM | POA: Diagnosis not present

## 2024-01-20 DIAGNOSIS — Z6835 Body mass index (BMI) 35.0-35.9, adult: Secondary | ICD-10-CM

## 2024-01-20 DIAGNOSIS — F419 Anxiety disorder, unspecified: Secondary | ICD-10-CM | POA: Insufficient documentation

## 2024-01-20 DIAGNOSIS — E785 Hyperlipidemia, unspecified: Secondary | ICD-10-CM

## 2024-01-20 DIAGNOSIS — Z833 Family history of diabetes mellitus: Secondary | ICD-10-CM | POA: Insufficient documentation

## 2024-01-20 NOTE — Assessment & Plan Note (Signed)
 Will check lipid profile at annual exam Continue rosuvastatin

## 2024-01-20 NOTE — Progress Notes (Signed)
 Subjective:    Patient ID: Jared Duncan, male    DOB: 11-27-1970, 53 y.o.   MRN: [id number]  HPI  Patient presents to the clinic today to establish care and for management of the conditions listed below.  Anxiety and depression: Chronic, managed on escitalopram .  He is not currently seeing a therapist.  He denies anxiety, SI/HI.  DM2: His last A1c was 6.1%, 03/2023. He is not taking any medications for this. He does not routinely check his blood sugars.  He checks his feet routinely.  His last eye exam was 2024, [place].  Flu never.  Pneumovax never.  COVID never.  HLD: His last LDL was 142, triglycerides 272, 03/2023. He denies myalgias on rosuvastatin. He tries to consumes a low fat diet.  OSA: He is not currently wearing a CPAP machine, but is using a mouth tape. His last sleep study was > 5 years ago.  He is also having left shoulder pain. This started 3-4 weeks ago. He describes the pain as sore and achy. The pain is worse with movement or laying on his left side. He denies numbness, tingling or weakness in his left arm. He denies any injury to the area. He has had a previous cortisone shot in the left shoulder but can not recall why but he does not take any medication OTC for this.  Review of Systems   Past Medical History:  Diagnosis Date   Depression     Current Outpatient Medications  Medication Sig Dispense Refill   ALPRAZolam  (XANAX ) 0.5 MG tablet Take 1 tablet (0.5 mg total) by mouth at bedtime as needed for anxiety. 2 tablet 0   escitalopram  (LEXAPRO ) 5 MG tablet Take 5 mg by mouth daily.     No current facility-administered medications for this visit.    No Known Allergies  No family history on file.  Social History   Socioeconomic History   Marital status: Married    Spouse name: Not on file   Number of children: Not on file   Years of education: Not on file   Highest education level: Some college, no degree  Occupational History   Not on file   Tobacco Use   Smoking status: Never    Passive exposure: Never   Smokeless tobacco: Never  Vaping Use   Vaping status: Never Used  Substance and Sexual Activity   Alcohol use: Yes   Drug use: Never   Sexual activity: Not on file  Other Topics Concern   Not on file  Social History Narrative   Not on file   Social Drivers of Health   Financial Resource Strain: Low Risk  (01/19/2024)   Overall Financial Resource Strain (CARDIA)    Difficulty of Paying Living Expenses: Not hard at all  Food Insecurity: No Food Insecurity (01/19/2024)   Hunger Vital Sign    Worried About Running Out of Food in the Last Year: Never true    Ran Out of Food in the Last Year: Never true  Transportation Needs: No Transportation Needs (01/19/2024)   PRAPARE - Administrator, Civil Service (Medical): No    Lack of Transportation (Non-Medical): No  Physical Activity: Unknown (01/19/2024)   Exercise Vital Sign    Days of Exercise per Week: 0 days    Minutes of Exercise per Session: Not on file  Stress: Stress Concern Present (01/19/2024)   [employer] of Occupational Health - Occupational Stress Questionnaire    Feeling of Stress :  To some extent  Social Connections: Moderately Integrated (01/19/2024)   Social Connection and Isolation Panel [NHANES]    Frequency of Communication with Friends and Family: More than three times a week    Frequency of Social Gatherings with Friends and Family: Twice a week    Attends Religious Services: 1 to 4 times per year    Active Member of [employer] or Organizations: No    Attends Engineer, structural: Not on file    Marital Status: Married  Catering manager Violence: Not on file     Constitutional: Denies fever, malaise, fatigue, headache or abrupt weight changes.  HEENT: Denies eye pain, eye redness, ear pain, ringing in the ears, wax buildup, runny nose, nasal congestion, bloody nose, or sore throat. Respiratory: Denies difficulty breathing,  shortness of breath, cough or sputum production.   Cardiovascular: Denies chest pain, chest tightness, palpitations or swelling in the hands or feet.  Gastrointestinal: Denies abdominal pain, bloating, constipation, diarrhea or blood in the stool.  GU: Denies urgency, frequency, pain with urination, burning sensation, blood in urine, odor or discharge. Musculoskeletal: Pt reports left shoulder pain. Denies decrease in range of motion, difficulty with gait, muscle pain or joint swelling.  Skin: Denies redness, rashes, lesions or ulcercations.  Neurological: Denies dizziness, difficulty with memory, difficulty with speech or problems with balance and coordination.  Psych: Patient has a history of anxiety and depression.  Denies SI/HI.  No other specific complaints in a complete review of systems (except as listed in HPI above).      Objective:   Physical Exam  BP 110/68 (BP Location: Left Arm, Patient Position: Sitting, Cuff Size: Large)   Ht 5\' 11"  (1.803 m)   Wt 254 lb 6 oz (115.4 kg)   BMI 35.48 kg/m   Wt Readings from Last 3 Encounters:  12/23/22 247 lb (112 kg)  12/09/22 253 lb 12.8 oz (115.1 kg)  01/08/21 243 lb 2.7 oz (110.3 kg)    General: Appears his stated age, obese, in NAD. Skin: Warm, dry and intact. No ulcerations noted. HEENT: Head: normal shape and size; Eyes: sclera white, no icterus, conjunctiva pink, PERRLA and EOMs intact;  Cardiovascular: Normal rate and rhythm. S1,S2 noted.  No murmur, rubs or gallops noted. No JVD or BLE edema. No carotid bruits noted. Pulmonary/Chest: Normal effort and positive vesicular breath sounds. No respiratory distress. No wheezes, rales or ronchi noted.  Musculoskeletal: Normal internal and external rotation of left shoulder.  Pain with palpation of the left AC joint.  Negative drop can test on left.  Strength 5/5 BUE.  Handgrips equal.  No difficulty with gait.  Neurological: Alert and oriented. Cranial nerves II-XII grossly intact.  Coordination normal.  Psychiatric: Mood and affect normal. Behavior is normal. Judgment and thought content normal.    BMET    Component Value Date/Time   NA 138 01/12/2021 0552   K 4.5 01/12/2021 0552   CL 99 01/12/2021 0552   CO2 28 01/12/2021 0552   GLUCOSE 224 (H) 01/12/2021 0552   BUN 23 (H) 01/12/2021 0552   CREATININE 0.93 01/12/2021 0552   CALCIUM 8.9 01/12/2021 0552   GFRNONAA >60 01/12/2021 0552    Lipid Panel  No results found for: "CHOL", "TRIG", "HDL", "CHOLHDL", "VLDL", "LDLCALC"  CBC    Component Value Date/Time   WBC 8.1 01/12/2021 0552   RBC 5.02 01/12/2021 0552   HGB 15.5 01/12/2021 0552   HCT 44.8 01/12/2021 0552   PLT 256 01/12/2021 0552  MCV 89.2 01/12/2021 0552   MCH 30.9 01/12/2021 0552   MCHC 34.6 01/12/2021 0552   RDW 13.3 01/12/2021 0552   LYMPHSABS 0.7 01/12/2021 0552   MONOABS 0.6 01/12/2021 0552   EOSABS 0.0 01/12/2021 0552   BASOSABS 0.0 01/12/2021 0552    Hgb A1C Lab Results  Component Value Date   HGBA1C 6.8 (H) 01/11/2021            Assessment & Plan:   Left shoulder pain:  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

## 2024-01-20 NOTE — Assessment & Plan Note (Signed)
 Encouraged diet and exercise for weight loss ?

## 2024-01-20 NOTE — Patient Instructions (Signed)

## 2024-01-20 NOTE — Assessment & Plan Note (Signed)
 Using mouth tape at this time Encouraged weight loss as this can help reduce sleep apnea symptoms

## 2024-01-20 NOTE — Assessment & Plan Note (Signed)
 Stable on current dose of escitalopram Support offered

## 2024-01-20 NOTE — Assessment & Plan Note (Signed)
 Will check A1c and urine microalbumin at annual exam Not medicated Encouraged low-carb diet and exercise for weight loss Will request copy of eye exam Encouraged routine foot exam He declines immunizations today

## 2024-03-28 ENCOUNTER — Ambulatory Visit: Admitting: Internal Medicine

## 2024-04-17 ENCOUNTER — Ambulatory Visit (INDEPENDENT_AMBULATORY_CARE_PROVIDER_SITE_OTHER): Admitting: Internal Medicine

## 2024-04-17 ENCOUNTER — Ambulatory Visit
Admission: RE | Admit: 2024-04-17 | Discharge: 2024-04-17 | Disposition: A | Discharge status: Home / Self Care | Source: Ambulatory Visit | Attending: Internal Medicine | Admitting: Internal Medicine

## 2024-04-17 VITALS — BP 114/72 | Ht 71.0 in | Wt 256.0 lb

## 2024-04-17 DIAGNOSIS — E1169 Type 2 diabetes mellitus with other specified complication: Secondary | ICD-10-CM

## 2024-04-17 DIAGNOSIS — G8929 Other chronic pain: Secondary | ICD-10-CM | POA: Diagnosis present

## 2024-04-17 DIAGNOSIS — M25512 Pain in left shoulder: Secondary | ICD-10-CM

## 2024-04-17 DIAGNOSIS — Z0001 Encounter for general adult medical examination with abnormal findings: Secondary | ICD-10-CM | POA: Diagnosis not present

## 2024-04-17 DIAGNOSIS — E119 Type 2 diabetes mellitus without complications: Secondary | ICD-10-CM | POA: Diagnosis not present

## 2024-04-17 DIAGNOSIS — Z125 Encounter for screening for malignant neoplasm of prostate: Secondary | ICD-10-CM

## 2024-04-17 DIAGNOSIS — E785 Hyperlipidemia, unspecified: Secondary | ICD-10-CM

## 2024-04-17 DIAGNOSIS — E66812 Obesity, class 2: Secondary | ICD-10-CM | POA: Diagnosis not present

## 2024-04-17 DIAGNOSIS — Z6835 Body mass index (BMI) 35.0-35.9, adult: Secondary | ICD-10-CM

## 2024-04-17 NOTE — Patient Instructions (Signed)
 Health Maintenance, Male  Adopting a healthy lifestyle and getting preventive care are important in promoting health and wellness. Ask your health care provider about:  The right schedule for you to have regular tests and exams.  Things you can do on your own to prevent diseases and keep yourself healthy.  What should I know about diet, weight, and exercise?  Eat a healthy diet    Eat a diet that includes plenty of vegetables, fruits, low-fat dairy products, and lean protein.  Do not eat a lot of foods that are high in solid fats, added sugars, or sodium.  Maintain a healthy weight  Body mass index (BMI) is a measurement that can be used to identify possible weight problems. It estimates body fat based on height and weight. Your health care provider can help determine your BMI and help you achieve or maintain a healthy weight.  Get regular exercise  Get regular exercise. This is one of the most important things you can do for your health. Most adults should:  Exercise for at least 150 minutes each week. The exercise should increase your heart rate and make you sweat (moderate-intensity exercise).  Do strengthening exercises at least twice a week. This is in addition to the moderate-intensity exercise.  Spend less time sitting. Even light physical activity can be beneficial.  Watch cholesterol and blood lipids  Have your blood tested for lipids and cholesterol at 53 years of age, then have this test every 5 years.  You may need to have your cholesterol levels checked more often if:  Your lipid or cholesterol levels are high.  You are older than 53 years of age.  You are at high risk for heart disease.  What should I know about cancer screening?  Many types of cancers can be detected early and may often be prevented. Depending on your health history and family history, you may need to have cancer screening at various ages. This may include screening for:  Colorectal cancer.  Prostate cancer.  Skin cancer.  Lung  cancer.  What should I know about heart disease, diabetes, and high blood pressure?  Blood pressure and heart disease  High blood pressure causes heart disease and increases the risk of stroke. This is more likely to develop in people who have high blood pressure readings or are overweight.  Talk with your health care provider about your target blood pressure readings.  Have your blood pressure checked:  Every 3-5 years if you are 24-52 years of age.  Every year if you are 3 years old or older.  If you are between the ages of 60 and 72 and are a current or former smoker, ask your health care provider if you should have a one-time screening for abdominal aortic aneurysm (AAA).  Diabetes  Have regular diabetes screenings. This checks your fasting blood sugar level. Have the screening done:  Once every three years after age 66 if you are at a normal weight and have a low risk for diabetes.  More often and at a younger age if you are overweight or have a high risk for diabetes.  What should I know about preventing infection?  Hepatitis B  If you have a higher risk for hepatitis B, you should be screened for this virus. Talk with your health care provider to find out if you are at risk for hepatitis B infection.  Hepatitis C  Blood testing is recommended for:  Everyone born from [birth year] through 1965.  Anyone  with known risk factors for hepatitis C.  Sexually transmitted infections (STIs)  You should be screened each year for STIs, including gonorrhea and chlamydia, if:  You are sexually active and are younger than 53 years of age.  You are older than 53 years of age and your health care provider tells you that you are at risk for this type of infection.  Your sexual activity has changed since you were last screened, and you are at increased risk for chlamydia or gonorrhea. Ask your health care provider if you are at risk.  Ask your health care provider about whether you are at high risk for HIV. Your health care provider  may recommend a prescription medicine to help prevent HIV infection. If you choose to take medicine to prevent HIV, you should first get tested for HIV. You should then be tested every 3 months for as long as you are taking the medicine.  Follow these instructions at home:  Alcohol use  Do not drink alcohol if your health care provider tells you not to drink.  If you drink alcohol:  Limit how much you have to 0-2 drinks a day.  Know how much alcohol is in your drink. In the [place] one drink equals one 12 oz bottle of beer (355 mL), one 5 oz glass of wine (148 mL), or one 1 oz glass of hard liquor (44 mL).  Lifestyle  Do not use any products that contain nicotine or tobacco. These products include cigarettes, chewing tobacco, and vaping devices, such as e-cigarettes. If you need help quitting, ask your health care provider.  Do not use street drugs.  Do not share needles.  Ask your health care provider for help if you need support or information about quitting drugs.  General instructions  Schedule regular health, dental, and eye exams.  Stay current with your vaccines.  Tell your health care provider if:  You often feel depressed.  You have ever been abused or do not feel safe at home.  Summary  Adopting a healthy lifestyle and getting preventive care are important in promoting health and wellness.  Follow your health care provider's instructions about healthy diet, exercising, and getting tested or screened for diseases.  Follow your health care provider's instructions on monitoring your cholesterol and blood pressure.  This information is not intended to replace advice given to you by your health care provider. Make sure you discuss any questions you have with your health care provider.  Document Revised: 12/30/2020 Document Reviewed: 12/30/2020  Elsevier Patient Education  2024 [employer].

## 2024-04-17 NOTE — Progress Notes (Signed)
 Subjective:    Patient ID: Jared Duncan, male    DOB: [birth year], 53 y.o.   MRN: [id number]  HPI  Patient presents to the clinic today for his annual exam.  He continues to have left shoulder pain.  This has been going on for at least 6 months.  The pain is located in the top of the shoulder.  He takes Aleve with some relief of symptoms.  He denies any specific injury to the area.  There is no imaging on file but he is requesting this today.  Flu: never Tetanus: > 10 years ago Covid: never Prevnar 20: never Shingrix: never PSA screening: never Colon screening: scheduled 05/03/24 Vision screening: annually Dentist: biannually  Diet: He does eat meat. He consumes some fruit and veggies. He does eat some fried foods. He drinks mostly water. Exercise: Walking  Review of Systems   Past Medical History:  Diagnosis Date   Anxiety APRIL 2023   Depression    Sleep apnea SEVERAL YEARS    Current Outpatient Medications  Medication Sig Dispense Refill   escitalopram  (LEXAPRO ) 5 MG tablet Take 5 mg by mouth daily.     rosuvastatin  (CRESTOR ) 10 MG tablet Take 10 mg by mouth at bedtime.     No current facility-administered medications for this visit.    Allergies  Allergen Reactions   Other Rash    coconut   Pineapple Rash    Family History  Problem Relation Age of Onset   Cancer Mother    Diabetes Mother    Diabetes Father     Social History   Socioeconomic History   Marital status: Married    Spouse name: Not on file   Number of children: Not on file   Years of education: Not on file   Highest education level: Some college, no degree  Occupational History   Not on file  Tobacco Use   Smoking status: Never    Passive exposure: Never   Smokeless tobacco: Never  Vaping Use   Vaping status: Never Used  Substance and Sexual Activity   Alcohol use: Not Currently   Drug use: Never   Sexual activity: Yes    Birth control/protection: None  Other Topics  Concern   Not on file  Social History Narrative   Not on file   Social Drivers of Health   Financial Resource Strain: Low Risk  (04/17/2024)   Overall Financial Resource Strain (CARDIA)    Difficulty of Paying Living Expenses: Not hard at all  Food Insecurity: Unknown (04/17/2024)   Hunger Vital Sign    Worried About Running Out of Food in the Last Year: Not on file    Ran Out of Food in the Last Year: Never true  Transportation Needs: No Transportation Needs (04/17/2024)   PRAPARE - Administrator, Civil Service (Medical): No    Lack of Transportation (Non-Medical): No  Physical Activity: Inactive (04/17/2024)   Exercise Vital Sign    Days of Exercise per Week: 0 days    Minutes of Exercise per Session: Not on file  Stress: Stress Concern Present (04/17/2024)   [employer] of Occupational Health - Occupational Stress Questionnaire    Feeling of Stress: To some extent  Social Connections: Moderately Integrated (04/17/2024)   Social Connection and Isolation Panel    Frequency of Communication with Friends and Family: More than three times a week    Frequency of Social Gatherings with Friends and Family: Twice a week  Attends Religious Services: 1 to 4 times per year    Active Member of Clubs or Organizations: No    Attends Engineer, structural: Not on file    Marital Status: Married  Catering manager Violence: Not on file     Constitutional: Pt reports fatigue. Denies fever, malaise, headache or abrupt weight changes.  HEENT: Denies eye pain, eye redness, ear pain, ringing in the ears, wax buildup, runny nose, nasal congestion, bloody nose, or sore throat. Respiratory: Denies difficulty breathing, shortness of breath, cough or sputum production.   Cardiovascular: Denies chest pain, chest tightness, palpitations or swelling in the hands or feet.  Gastrointestinal: Denies abdominal pain, bloating, constipation, diarrhea or blood in the stool.  GU: Denies  urgency, frequency, pain with urination, burning sensation, blood in urine, odor or discharge. Musculoskeletal: Pt reports left shoulder pain. Denies decrease in range of motion, difficulty with gait, muscle pain or joint swelling.  Skin: Denies redness, rashes, lesions or ulcercations.  Neurological: Denies dizziness, difficulty with memory, difficulty with speech or problems with balance and coordination.  Psych: Patient has a history of anxiety and depression.  Denies SI/HI.  No other specific complaints in a complete review of systems (except as listed in HPI above).      Objective:   Physical Exam BP 114/72 (BP Location: Right Arm, Patient Position: Sitting, Cuff Size: Large)   Ht 5' 11 (1.803 m)   Wt 256 lb (116.1 kg)   BMI 35.70 kg/m    Wt Readings from Last 3 Encounters:  01/20/24 254 lb 6 oz (115.4 kg)  12/23/22 247 lb (112 kg)  12/09/22 253 lb 12.8 oz (115.1 kg)    General: Appears his stated age, obese, in NAD. Skin: Warm, dry and intact. No ulcerations noted. HEENT: Head: normal shape and size; Eyes: sclera white, no icterus, conjunctiva pink, PERRLA and EOMs intact;  Cardiovascular: Normal rate and rhythm. S1,S2 noted.  No murmur, rubs or gallops noted. No JVD or BLE edema. No carotid bruits noted. Pulmonary/Chest: Normal effort and positive vesicular breath sounds. No respiratory distress. No wheezes, rales or ronchi noted.  Musculoskeletal: Normal internal and external rotation of left shoulder.  Pain with palpation of the left AC joint.  Negative drop can test on left.  Strength 5/5 BUE.  Handgrips equal.  No difficulty with gait.  Neurological: Alert and oriented. Cranial nerves II-XII grossly intact. Coordination normal.  Psychiatric: Mood and affect normal. Behavior is normal. Judgment and thought content normal.    BMET    Component Value Date/Time   NA 138 01/12/2021 0552   K 4.5 01/12/2021 0552   CL 99 01/12/2021 0552   CO2 28 01/12/2021 0552   GLUCOSE  224 (H) 01/12/2021 0552   BUN 23 (H) 01/12/2021 0552   CREATININE 0.93 01/12/2021 0552   CALCIUM  8.9 01/12/2021 0552   GFRNONAA >60 01/12/2021 0552    Lipid Panel  No results found for: CHOL, TRIG, HDL, CHOLHDL, VLDL, LDLCALC  CBC    Component Value Date/Time   WBC 8.1 01/12/2021 0552   RBC 5.02 01/12/2021 0552   HGB 15.5 01/12/2021 0552   HCT 44.8 01/12/2021 0552   PLT 256 01/12/2021 0552   MCV 89.2 01/12/2021 0552   MCH 30.9 01/12/2021 0552   MCHC 34.6 01/12/2021 0552   RDW 13.3 01/12/2021 0552   LYMPHSABS 0.7 01/12/2021 0552   MONOABS 0.6 01/12/2021 0552   EOSABS 0.0 01/12/2021 0552   BASOSABS 0.0 01/12/2021 0552    Hgb  A1C Lab Results  Component Value Date   HGBA1C 6.8 (H) 01/11/2021            Assessment & Plan:    RTC in 6 months for follow up chronic conditions Angeline Laura, NP

## 2024-04-17 NOTE — Assessment & Plan Note (Signed)
 Will obtain x-ray of shoulder at this time Consider referral to orthopedics pending x-ray

## 2024-04-17 NOTE — Assessment & Plan Note (Signed)
 Encouraged diet and exercise for weight loss ?

## 2024-04-18 ENCOUNTER — Ambulatory Visit: Payer: Self-pay | Admitting: Internal Medicine

## 2024-04-18 LAB — COMPREHENSIVE METABOLIC PANEL WITH GFR
AG Ratio: 1.9 (calc) (ref 1.0–2.5)
ALT: 23 U/L (ref 9–46)
AST: 19 U/L (ref 10–35)
Albumin: 4.6 g/dL (ref 3.6–5.1)
Alkaline phosphatase (APISO): 79 U/L (ref 35–144)
BUN: 18 mg/dL (ref 7–25)
CO2: 31 mmol/L (ref 20–32)
Calcium: 9.5 mg/dL (ref 8.6–10.3)
Chloride: 100 mmol/L (ref 98–110)
Creat: 1.12 mg/dL (ref 0.70–1.30)
Globulin: 2.4 g/dL (ref 1.9–3.7)
Glucose, Bld: 130 mg/dL — ABNORMAL HIGH (ref 65–99)
Potassium: 4.1 mmol/L (ref 3.5–5.3)
Sodium: 139 mmol/L (ref 135–146)
Total Bilirubin: 0.5 mg/dL (ref 0.2–1.2)
Total Protein: 7 g/dL (ref 6.1–8.1)
eGFR: 79 mL/min/1.73m2 (ref >=60)

## 2024-04-18 LAB — CBC
HCT: 43.4 % (ref 38.5–50.0)
Hemoglobin: 14.8 g/dL (ref 13.2–17.1)
MCH: 32.3 pg (ref 27.0–33.0)
MCHC: 34.1 g/dL (ref 32.0–36.0)
MCV: 94.8 fL (ref 80.0–100.0)
MPV: 9.5 fL (ref 7.5–12.5)
Platelets: 229 Thousand/uL (ref 140–400)
RBC: 4.58 Million/uL (ref 4.20–5.80)
RDW: 13 % (ref 11.0–15.0)
WBC: 5.4 Thousand/uL (ref 3.8–10.8)

## 2024-04-18 LAB — LIPID PANEL
Cholesterol: 145 mg/dL (ref <200)
HDL: 48 mg/dL (ref >=40)
LDL Cholesterol (Calc): 67 mg/dL
Non-HDL Cholesterol (Calc): 97 mg/dL (ref <130)
Total CHOL/HDL Ratio: 3 (calc) (ref <5.0)
Triglycerides: 239 mg/dL — ABNORMAL HIGH (ref <150)

## 2024-04-18 LAB — HEMOGLOBIN A1C
Hgb A1c MFr Bld: 6.3 % — ABNORMAL HIGH (ref <5.7)
Mean Plasma Glucose: 134 mg/dL
eAG (mmol/L): 7.4 mmol/L

## 2024-04-18 LAB — MICROALBUMIN / CREATININE URINE RATIO
Creatinine, Urine: 212 mg/dL (ref 20–320)
Microalb Creat Ratio: 3 mg/g{creat} (ref <30)
Microalb, Ur: 0.6 mg/dL

## 2024-04-18 LAB — PSA: PSA: 0.77 ng/mL (ref <=4.00)

## 2024-04-18 MED ORDER — ROSUVASTATIN CALCIUM 20 MG PO TABS: 20.0000 mg | ORAL_TABLET | Freq: Every day | ORAL | 1 refills | Status: AC

## 2024-05-03 ENCOUNTER — Ambulatory Visit

## 2024-05-03 DIAGNOSIS — D124 Benign neoplasm of descending colon: Secondary | ICD-10-CM | POA: Diagnosis not present

## 2024-05-03 DIAGNOSIS — K5730 Diverticulosis of large intestine without perforation or abscess without bleeding: Secondary | ICD-10-CM | POA: Diagnosis not present

## 2024-05-03 DIAGNOSIS — K621 Rectal polyp: Secondary | ICD-10-CM | POA: Diagnosis not present

## 2024-05-03 DIAGNOSIS — D122 Benign neoplasm of ascending colon: Secondary | ICD-10-CM | POA: Diagnosis not present

## 2024-05-03 DIAGNOSIS — Z1211 Encounter for screening for malignant neoplasm of colon: Secondary | ICD-10-CM | POA: Diagnosis present

## 2024-08-28 ENCOUNTER — Ambulatory Visit: Admitting: Internal Medicine

## 2024-08-29 ENCOUNTER — Other Ambulatory Visit: Payer: Self-pay | Admitting: Internal Medicine

## 2024-08-30 NOTE — Telephone Encounter (Signed)
 Requested medication (s) are due for refill today: routing for review  Requested medication (s) are on the active medication list: no  Last refill:  01/04/21  Future visit scheduled: {Yes  Notes to clinic:  historical medication     Requested Prescriptions  Pending Prescriptions Disp Refills   escitalopram  (LEXAPRO ) 5 MG tablet [Pharmacy Med Name: ESCITALOPRAM  5 MG TABLET] 90 tablet 3    Sig: TAKE 1 TABLET BY MOUTH EVERY DAY     Psychiatry:  Antidepressants - SSRI Passed - 08/30/2024  2:30 PM      Passed - Completed PHQ-2 or PHQ-9 in the last 360 days      Passed - Valid encounter within last 6 months    Recent Outpatient Visits           4 months ago Encounter for general adult medical examination with abnormal findings   [facility] [place], Angeline ORN, NP   7 months ago OSA (obstructive sleep apnea)   [facility] [place], Angeline ORN, [place]

## 2024-09-01 ENCOUNTER — Encounter: Payer: Self-pay | Admitting: Internal Medicine

## 2024-09-01 ENCOUNTER — Telehealth: Admitting: Internal Medicine

## 2024-09-01 DIAGNOSIS — G4733 Obstructive sleep apnea (adult) (pediatric): Secondary | ICD-10-CM

## 2024-09-01 DIAGNOSIS — F331 Major depressive disorder, recurrent, moderate: Secondary | ICD-10-CM | POA: Insufficient documentation

## 2024-09-01 DIAGNOSIS — E119 Type 2 diabetes mellitus without complications: Secondary | ICD-10-CM

## 2024-09-01 MED ORDER — WEGOVY 0.5 MG/0.5ML ~~LOC~~ SOAJ: 0.5000 mg | SUBCUTANEOUS | 0 refills | Status: DC

## 2024-09-01 NOTE — Telephone Encounter (Signed)
 Patient scheduled.

## 2024-09-01 NOTE — Telephone Encounter (Signed)
 Okay to put him at a virtual appointment today at 4 PM if he can but lets try to do the appointment at 11:40

## 2024-09-01 NOTE — Progress Notes (Signed)
 Virtual Visit via Video Note  I connected with Jared Duncan on 09/01/2024 at  4:00 PM EST by a video enabled telemedicine application and verified that I am speaking with the correct person using two identifiers.  Location: Patient: Work Provider: Engineer, Structural in this video call: Angeline Laura, NP-C and Parminder Cupples   I discussed the limitations of evaluation and management by telemedicine and the availability of in person appointments. The patient expressed understanding and agreed to proceed.  History of Present Illness:   Discussed the use of AI scribe software for clinical note transcription with the patient, who gave verbal consent to proceed.  Jared Duncan is a 54 year old male who presents for weight management consultation.  He is interested in starting Wegovy  for weight management, a medication his wife is currently using. He has been attempting to manage his weight through diet and exercise but feels he needs additional help.  He has a history of DM 2, with his last A1c recorded at 6.3%, which is an improvement from a previous reading of 6.8% three years ago. He is not currently on any medication for diabetes.  He has sleep apnea but does not use a CPAP machine. He is considering a dental device for sleep apnea, which his dentist will provide after completing a crown procedure. He has not had a sleep study in several years.      Past Medical History:  Diagnosis Date   Anxiety APRIL 2023   Depression    Sleep apnea SEVERAL YEARS    Current Outpatient Medications  Medication Sig Dispense Refill   escitalopram  (LEXAPRO ) 5 MG tablet TAKE 1 TABLET BY MOUTH EVERY DAY 90 tablet 0   rosuvastatin  (CRESTOR ) 20 MG tablet Take 1 tablet (20 mg total) by mouth daily. 90 tablet 1   No current facility-administered medications for this visit.    Allergies[1]  Family History  Problem Relation Age of Onset   Cancer Mother     Diabetes Mother    Diabetes Father     Social History   Socioeconomic History   Marital status: Married    Spouse name: Not on file   Number of children: Not on file   Years of education: Not on file   Highest education level: Some college, no degree  Occupational History   Not on file  Tobacco Use   Smoking status: Never    Passive exposure: Never   Smokeless tobacco: Never  Vaping Use   Vaping status: Never Used  Substance and Sexual Activity   Alcohol use: Not Currently   Drug use: Never   Sexual activity: Yes    Birth control/protection: None  Other Topics Concern   Not on file  Social History Narrative   Not on file   Social Drivers of Health   Tobacco Use: Low Risk (04/17/2024)   Patient History    Smoking Tobacco Use: Never    Smokeless Tobacco Use: Never    Passive Exposure: Never  Financial Resource Strain: Low Risk (04/17/2024)   Overall Financial Resource Strain (CARDIA)    Difficulty of Paying Living Expenses: Not hard at all  Food Insecurity: Unknown (04/17/2024)   Epic    Worried About Programme Researcher, Broadcasting/film/video in the Last Year: Not on file    [employer] of Food in the Last Year: Never true  Transportation Needs: No Transportation Needs (04/17/2024)   Epic    Lack of Transportation (Medical): No  Lack of Transportation (Non-Medical): No  Physical Activity: Inactive (04/17/2024)   Exercise Vital Sign    Days of Exercise per Week: 0 days    Minutes of Exercise per Session: Not on file  Stress: Stress Concern Present (04/17/2024)   [employer] of Occupational Health - Occupational Stress Questionnaire    Feeling of Stress: To some extent  Social Connections: Moderately Integrated (04/17/2024)   Social Connection and Isolation Panel    Frequency of Communication with Friends and Family: More than three times a week    Frequency of Social Gatherings with Friends and Family: Twice a week    Attends Religious Services: 1 to 4 times per year    Active Member  of Clubs or Organizations: No    Attends Engineer, Structural: Not on file    Marital Status: Married  Catering Manager Violence: Not on file  Depression (PHQ2-9): Low Risk (04/17/2024)   Depression (PHQ2-9)    PHQ-2 Score: 0  Alcohol Screen: Low Risk (04/17/2024)   Alcohol Screen    Last Alcohol Screening Score (AUDIT): 1  Housing: Low Risk (04/17/2024)   Epic    Unable to Pay for Housing in the Last Year: No    Number of Times Moved in the Last Year: 0    Homeless in the Last Year: No  Utilities: Not on file  Health Literacy: Not on file     Constitutional: Denies fever, malaise, fatigue, headache or abrupt weight changes.  HEENT: Denies eye pain, eye redness, ear pain, ringing in the ears, wax buildup, runny nose, nasal congestion, bloody nose, or sore throat. Respiratory: Denies difficulty breathing, shortness of breath, cough or sputum production.   Cardiovascular: Denies chest pain, chest tightness, palpitations or swelling in the hands or feet.  Gastrointestinal: Denies abdominal pain, bloating, constipation, diarrhea or blood in the stool.  GU: Denies urgency, frequency, pain with urination, burning sensation, blood in urine, odor or discharge. Musculoskeletal: Patient reports chronic left shoulder pain.  Denies decrease in range of motion, difficulty with gait, muscle pain or joint swelling.  Skin: Denies redness, rashes, lesions or ulcercations.  Neurological:  Pt reports snoring. Denies dizziness, difficulty with memory, difficulty with speech or problems with balance and coordination.  Psych: Patient has a history of anxiety and depression.  Denies anxiety, depression, SI/HI.  No other specific complaints in a complete review of systems (except as listed in HPI above).  Observations/Objective:   Wt Readings from Last 3 Encounters:  04/17/24 256 lb (116.1 kg)  01/20/24 254 lb 6 oz (115.4 kg)  12/23/22 247 lb (112 kg)    General: Appears his stated age,  obese, in NAD. Pulmonary/Chest: Normal effort. No respiratory distress.  Neurological: Alert and oriented.   BMET    Component Value Date/Time   NA 139 04/17/2024 1514   K 4.1 04/17/2024 1514   CL 100 04/17/2024 1514   CO2 31 04/17/2024 1514   GLUCOSE 130 (H) 04/17/2024 1514   BUN 18 04/17/2024 1514   CREATININE 1.12 04/17/2024 1514   CALCIUM  9.5 04/17/2024 1514   GFRNONAA >60 01/12/2021 0552    Lipid Panel     Component Value Date/Time   CHOL 145 04/17/2024 1514   TRIG 239 (H) 04/17/2024 1514   HDL 48 04/17/2024 1514   CHOLHDL 3.0 04/17/2024 1514   LDLCALC 67 04/17/2024 1514    CBC    Component Value Date/Time   WBC 5.4 04/17/2024 1514   RBC 4.58 04/17/2024 1514  HGB 14.8 04/17/2024 1514   HCT 43.4 04/17/2024 1514   PLT 229 04/17/2024 1514   MCV 94.8 04/17/2024 1514   MCH 32.3 04/17/2024 1514   MCHC 34.1 04/17/2024 1514   RDW 13.0 04/17/2024 1514   LYMPHSABS 0.7 01/12/2021 0552   MONOABS 0.6 01/12/2021 0552   EOSABS 0.0 01/12/2021 0552   BASOSABS 0.0 01/12/2021 0552    Hgb A1C Lab Results  Component Value Date   HGBA1C 6.3 (H) 04/17/2024       Assessment and Plan:  Assessment and Plan    Morbid Obesity Management with pharmacotherapy considered. Wegovy  preferred. Insurance coverage uncertain. Discussed Wegovy  side effects and contraindications. - Sent prescription for Wegovy  0.5 mg to pharmacy for prior authorization. - Provided Wegovy  0.25 mg samples for immediate use. - Instructed to administer Wegovy  once weekly, same day every week, and store in the refrigerator. - Advised to inform if insurance does not cover Wegovy  for alternative options.  Type 2 diabetes mellitus A1c improved to 6.3 without medication. Discussed potential use of Mounjaro contingent on metformin trial. Insurance coverage for diabetes medications may be favorable. - Consider trial of metformin for 30 days to assess tolerance before starting Mounjaro.  Obstructive sleep  apnea Sleep apnea present. Discussed potential benefit of Zepbound contingent on insurance coverage. Repeat sleep study may be necessary. - Consider repeat sleep study to confirm moderate sleep apnea diagnosis. - Explore insurance coverage for Zepbound for sleep apnea treatment.       RTC in 1 month for follow-up of chronic conditions Follow Up Instructions:    I discussed the assessment and treatment plan with the patient. The patient was provided an opportunity to ask questions and all were answered. The patient agreed with the plan and demonstrated an understanding of the instructions.   The patient was advised to call back or seek an in-person evaluation if the symptoms worsen or if the condition fails to improve as anticipated.   Angeline Laura, NP     [1]  Allergies Allergen Reactions   Other Rash    coconut   Pineapple Rash

## 2024-09-04 ENCOUNTER — Other Ambulatory Visit (HOSPITAL_COMMUNITY): Payer: Self-pay

## 2024-09-04 ENCOUNTER — Telehealth: Payer: Self-pay

## 2024-09-04 NOTE — Telephone Encounter (Signed)
 Pharmacy Patient Advocate Encounter   Received notification from [facility] KEY that prior authorization for Wegovy  0.5 is required/requested.   Unable to verify insurance. [employer] card from patient media terminated 08/24/23

## 2024-09-05 ENCOUNTER — Telehealth: Payer: Self-pay

## 2024-09-05 ENCOUNTER — Encounter: Payer: Self-pay | Admitting: Internal Medicine

## 2024-09-05 NOTE — Telephone Encounter (Signed)
 Copied from CRM [id number]. Topic: Clinical - Prescription Issue >> Sep 05, 2024  3:48 PM Emylou G wrote: Reason for CRM: Patient called.. updated his eff date.. his card has [id number] the extra 00 represents him

## 2024-09-11 ENCOUNTER — Other Ambulatory Visit (HOSPITAL_COMMUNITY): Payer: Self-pay

## 2024-09-11 ENCOUNTER — Telehealth: Payer: Self-pay

## 2024-09-11 ENCOUNTER — Other Ambulatory Visit: Payer: Self-pay | Admitting: Internal Medicine

## 2024-09-11 MED ORDER — OZEMPIC (0.25 OR 0.5 MG/DOSE) 2 MG/3ML ~~LOC~~ SOPN: 0.5000 mg | PEN_INJECTOR | SUBCUTANEOUS | 0 refills | Status: AC

## 2024-09-11 MED ORDER — ONDANSETRON 4 MG PO TBDP: 4.0000 mg | ORAL_TABLET | Freq: Three times a day (TID) | ORAL | 0 refills | Status: AC | PRN

## 2024-09-11 NOTE — Telephone Encounter (Signed)
 He does have a history of DM2 however his last A1c was less than 6.5% and he has never tried metformin.  Is Wegovy  not covered on his insurance plan?

## 2024-09-11 NOTE — Telephone Encounter (Signed)
 Pharmacy Patient Advocate Encounter   Received notification from [facility] KEY that prior authorization for Ozempic  2 is required/requested.   Insurance verification completed.   The patient is insured through [employer].   Per test claim: PA required; PA submitted to above mentioned insurance via Latent Key/confirmation #/EOC [id number] Status is pending

## 2024-09-11 NOTE — Addendum Note (Signed)
 Addended by: ANTONETTE ANGELINE ORN on: 09/11/2024 12:20 PM   Modules accepted: Orders

## 2024-09-11 NOTE — Telephone Encounter (Signed)
 I guess we can try ozempic . I will send that in

## 2024-09-11 NOTE — Telephone Encounter (Signed)
 Received a request for a prior authorization for Wegovy .   I see the patient has a diagnosis Type 2 Diabetes Mellitus.   Would the provider like to change to Ozempic ?  Please advise

## 2024-09-12 ENCOUNTER — Other Ambulatory Visit (HOSPITAL_COMMUNITY): Payer: Self-pay

## 2024-09-18 ENCOUNTER — Other Ambulatory Visit (HOSPITAL_COMMUNITY): Payer: Self-pay

## 2024-09-28 ENCOUNTER — Encounter: Payer: Self-pay | Admitting: Internal Medicine

## 2024-09-28 NOTE — Telephone Encounter (Signed)
 Please have him set up another video visit to discuss. I'm not going to keep going back and forth through [employer].

## 2024-10-16 ENCOUNTER — Ambulatory Visit: Admitting: Internal Medicine
